# Patient Record
Sex: Female | Born: 1980 | Race: White | Hispanic: No | Marital: Married | Smoking: Never smoker
Health system: Southern US, Community
[De-identification: ages and names within clinical notes are randomized; demographics above are authoritative.]

## PROBLEM LIST (undated history)

## (undated) DIAGNOSIS — I499 Cardiac arrhythmia, unspecified: Secondary | ICD-10-CM

## (undated) DIAGNOSIS — N939 Abnormal uterine and vaginal bleeding, unspecified: Secondary | ICD-10-CM

## (undated) DIAGNOSIS — I10 Essential (primary) hypertension: Secondary | ICD-10-CM

## (undated) DIAGNOSIS — K219 Gastro-esophageal reflux disease without esophagitis: Secondary | ICD-10-CM

## (undated) DIAGNOSIS — R748 Abnormal levels of other serum enzymes: Secondary | ICD-10-CM

## (undated) DIAGNOSIS — F419 Anxiety disorder, unspecified: Secondary | ICD-10-CM

## (undated) DIAGNOSIS — R0602 Shortness of breath: Secondary | ICD-10-CM

## (undated) DIAGNOSIS — I471 Supraventricular tachycardia, unspecified: Secondary | ICD-10-CM

## (undated) DIAGNOSIS — F32A Depression, unspecified: Secondary | ICD-10-CM

## (undated) DIAGNOSIS — R112 Nausea with vomiting, unspecified: Secondary | ICD-10-CM

## (undated) DIAGNOSIS — F99 Mental disorder, not otherwise specified: Secondary | ICD-10-CM

## (undated) DIAGNOSIS — M797 Fibromyalgia: Secondary | ICD-10-CM

## (undated) DIAGNOSIS — Z9889 Other specified postprocedural states: Secondary | ICD-10-CM

## (undated) HISTORY — PX: DENTAL SURGERY: SHX609

## (undated) HISTORY — PX: BREAST SURGERY: SHX581

---

## 1998-06-01 ENCOUNTER — Other Ambulatory Visit: Admission: RE | Admit: 1998-06-01 | Discharge: 1998-06-01 | Payer: Self-pay | Admitting: *Deleted

## 1999-06-17 ENCOUNTER — Other Ambulatory Visit: Admission: RE | Admit: 1999-06-17 | Discharge: 1999-06-17 | Payer: Self-pay | Admitting: *Deleted

## 2000-08-24 ENCOUNTER — Emergency Department (HOSPITAL_COMMUNITY): Admission: EM | Admit: 2000-08-24 | Discharge: 2000-08-24 | Payer: Self-pay | Admitting: Emergency Medicine

## 2001-07-20 ENCOUNTER — Encounter: Payer: Self-pay | Admitting: Gastroenterology

## 2001-07-20 ENCOUNTER — Ambulatory Visit (HOSPITAL_COMMUNITY): Admission: RE | Admit: 2001-07-20 | Discharge: 2001-07-20 | Payer: Self-pay | Admitting: Gastroenterology

## 2005-04-27 ENCOUNTER — Ambulatory Visit (HOSPITAL_COMMUNITY): Admission: RE | Admit: 2005-04-27 | Discharge: 2005-04-27 | Payer: Self-pay | Admitting: Family Medicine

## 2011-09-30 ENCOUNTER — Other Ambulatory Visit (HOSPITAL_COMMUNITY)
Admission: RE | Admit: 2011-09-30 | Discharge: 2011-09-30 | Disposition: A | Discharge status: Home / Self Care | Payer: 59 | Source: Ambulatory Visit | Attending: Obstetrics and Gynecology | Admitting: Obstetrics and Gynecology

## 2011-09-30 ENCOUNTER — Other Ambulatory Visit: Payer: Self-pay | Admitting: Obstetrics and Gynecology

## 2011-09-30 DIAGNOSIS — Z01419 Encounter for gynecological examination (general) (routine) without abnormal findings: Secondary | ICD-10-CM | POA: Insufficient documentation

## 2011-11-09 ENCOUNTER — Encounter (HOSPITAL_COMMUNITY): Payer: Self-pay | Admitting: Pharmacist

## 2011-11-10 NOTE — Patient Instructions (Addendum)
YOUR PROCEDURE IS SCHEDULED ON:11/16/11  [facility] AT:1200 pm  USE DESK PHONE AND DIAL [phone] TO INFORM us OF YOUR ARRIVAL  CALL [phone] IF YOU HAVE ANY QUESTIONS OR PROBLEMS PRIOR TO YOUR ARRIVAL.  REMEMBER: DO NOT EAT AFTER MIDNIGHT :Tuesday  SPECIAL INSTRUCTIONS:clear liquids ok until 9:30 am   YOU MAY BRUSH YOUR TEETH THE MORNING OF SURGERY   TAKE THESE MEDICINES THE DAY OF SURGERY WITH SIP OF WATER:morning meds   DO NOT WEAR JEWELRY, EYE MAKEUP, LIPSTICK OR DARK FINGERNAIL POLISH DO NOT WEAR LOTIONS  DO NOT SHAVE FOR 48 HOURS PRIOR TO SURGERY  YOU WILL NOT BE ALLOWED TO DRIVE YOURSELF HOME.  NAME OF DRIVER: Fredrik Cove

## 2011-11-11 ENCOUNTER — Encounter (HOSPITAL_COMMUNITY)
Admission: RE | Admit: 2011-11-11 | Discharge: 2011-11-11 | Disposition: A | Discharge status: Home / Self Care | Payer: 59 | Source: Ambulatory Visit | Attending: Obstetrics and Gynecology | Admitting: Obstetrics and Gynecology

## 2011-11-11 ENCOUNTER — Encounter (HOSPITAL_COMMUNITY): Payer: Self-pay

## 2011-11-11 ENCOUNTER — Other Ambulatory Visit: Payer: Self-pay | Admitting: Obstetrics and Gynecology

## 2011-11-11 HISTORY — DX: Cardiac arrhythmia, unspecified: I49.9

## 2011-11-11 HISTORY — DX: Shortness of breath: R06.02

## 2011-11-11 HISTORY — DX: Mental disorder, not otherwise specified: F99

## 2011-11-11 HISTORY — DX: Abnormal uterine and vaginal bleeding, unspecified: N93.9

## 2011-11-11 HISTORY — DX: Gastro-esophageal reflux disease without esophagitis: K21.9

## 2011-11-11 HISTORY — DX: Abnormal levels of other serum enzymes: R74.8

## 2011-11-11 HISTORY — DX: Anxiety disorder, unspecified: F41.9

## 2011-11-11 LAB — DIFFERENTIAL
Lymphocytes Relative: 41 % (ref 12–46)
Monocytes Absolute: 0.6 10*3/uL (ref 0.1–1.0)
Monocytes Relative: 8 % (ref 3–12)
Neutro Abs: 3.9 10*3/uL (ref 1.7–7.7)
Neutrophils Relative %: 49 % (ref 43–77)

## 2011-11-11 LAB — BASIC METABOLIC PANEL
Calcium: 9.9 mg/dL (ref 8.4–10.5)
Chloride: 102 mEq/L (ref 96–112)
Creatinine, Ser: 0.62 mg/dL (ref 0.50–1.10)
GFR calc Af Amer: >90 mL/min (ref >90)
GFR calc non Af Amer: >90 mL/min (ref >90)

## 2011-11-11 LAB — CBC
MCHC: 33.8 g/dL (ref 30.0–36.0)
MCV: 89.2 fL (ref 78.0–100.0)
Platelets: 258 10*3/uL (ref 150–400)
RDW: 13.5 % (ref 11.5–15.5)
WBC: 7.8 10*3/uL (ref 4.0–10.5)

## 2011-11-13 ENCOUNTER — Other Ambulatory Visit: Payer: Self-pay | Admitting: Obstetrics and Gynecology

## 2011-11-13 NOTE — H&P (Signed)
10/28/2011  Reason for Appointment  1. PreOp/Clearance from Dr. Nicholos Johns   History of Present Illness  General:  [name] presents for preop for Hysteroscopy/D&C/pap smear work up abnormal uterine bleeding. Pt is morbidly obese with a long h/o spotting and intermittently heavy bleeding. On initial presentation, I was unable to see cervix b/c it was displaced well behind the symphisis pubis. Due to habitus, I was also unable to palpate her uterus. Ultrasound shows a thickened EMB, 1.69 cm and and enlarged anteverted uterus, 13 cm. [name] symptoms clearly warranted an EMB, but due to the difficulty with the pelvic exam, an exam under anesthesia and te above procedures was recommended. Pt has gotten surgical clearance from PCP Dr. Nicholos Johns. Pt still with light bleeding.   Current Medications  Lorazepam 0.5 MG Tablet 1 tablets as neededDr. Raquel James  Lisinopril-Hydrochlorothiazide 20-12.5 MG Tablet 1 tablet every morning  Toprol XL 100 MG Tablet Extended Release 24 Hour 1 tablet every day  Medication List reviewed and reconciled with the patient   Past Medical History  SVT  White coat hypertension  Elevated liver enzymes   Surgical History  C section   wisdom teeth removed   liver bx    Family History  Mother: hypertension, hyperlipidemia, diabetes   Brother 1: diabetes   Maternal Great GM with breast cancer.   Social History  General:  History of smoking  cigarettes: Former smoker Quit in year 2001 Pack-year Hx: 11 Smoking: quit in 2001.  Tobacco Exposure: Rarely.  Alcohol: occasionally.  Recreational drug use: never.  Exercise: minimal.  Marital Status: married.  Children: Boys, 1.    Gyn History  Periods : irregular, use to be every 2- 6 months.  LMP always has a brown colored discharge x 2 years, had a period after visit at OB/GYN 10/03/11.  Denies H/O Birth control .  Last pap smear date 09/30/11, WNL, no ecc.  Denies H/O Last mammogram date .  Denies H/O Abnormal pap  smear .  Denies H/O STD .  Menarche 11.    OB History  Number of pregnancies 1.  Pregnancy # 1 live birth, C-section delivery.    Allergies  Aspirin: UPSET STOMACH  Sulfa drugs (for allergy): HIVES   Hospitalization/Major Diagnostic Procedure  childbirth    Review of Systems  Denies SOB, chest pain, n/v, fever or chills.   Vital Signs  Wt 266, Wt change -.4 lb, Ht 63, BMI 47.11, Pulse sitting 85, BP sitting 126/79.   Physical Examination  NECK:  Thyroid: no thyromegaly.  HEART:  Heart sounds: normal, RRR, no murmur.  ABDOMEN:  General: no masses tenderness or organomegaly, soft, non distended, increased pannus.  FEMALE GENITOURINARY:  General deferred.  EXTREMITIES:  General: No calf tenderness.     Assessments   1. Preop examination - V72.84 (Primary)   2. Abnormal menses - 626.9   3. Morbid obesity - 278.01   Treatment  1. Preop examination  Pt counseled on R/B/A of procedure. Pt desires to proceed with surgery. Will likely prescribe Provera post op if endometrial sampling is normal.    2. Abnormal menses  Referral To: Reason:Precert and schedule-Hyst/D&C, Pap Smeat    Follow Up  2 weeks post op

## 2011-11-15 MED ORDER — CEFAZOLIN SODIUM-DEXTROSE 2-3 GM-% IV SOLR
2.0000 g | INTRAVENOUS | Status: AC
  Administered 2011-11-16: 2 g via INTRAVENOUS
  Filled 2011-11-15: qty 50

## 2011-11-16 ENCOUNTER — Encounter (HOSPITAL_COMMUNITY)
Admission: RE | Disposition: A | Discharge status: Home / Self Care | Payer: Self-pay | Source: Ambulatory Visit | Attending: Obstetrics and Gynecology

## 2011-11-16 ENCOUNTER — Encounter (HOSPITAL_COMMUNITY): Payer: Self-pay | Admitting: Anesthesiology

## 2011-11-16 ENCOUNTER — Encounter (HOSPITAL_COMMUNITY): Payer: Self-pay | Admitting: *Deleted

## 2011-11-16 ENCOUNTER — Ambulatory Visit (HOSPITAL_COMMUNITY): Payer: 59 | Admitting: Anesthesiology

## 2011-11-16 ENCOUNTER — Ambulatory Visit (HOSPITAL_COMMUNITY)
Admission: RE | Admit: 2011-11-16 | Discharge: 2011-11-16 | Disposition: A | Discharge status: Home / Self Care | Payer: 59 | Source: Ambulatory Visit | Attending: Obstetrics and Gynecology | Admitting: Obstetrics and Gynecology

## 2011-11-16 DIAGNOSIS — N949 Unspecified condition associated with female genital organs and menstrual cycle: Secondary | ICD-10-CM | POA: Insufficient documentation

## 2011-11-16 DIAGNOSIS — Z01812 Encounter for preprocedural laboratory examination: Secondary | ICD-10-CM | POA: Insufficient documentation

## 2011-11-16 DIAGNOSIS — N938 Other specified abnormal uterine and vaginal bleeding: Secondary | ICD-10-CM | POA: Insufficient documentation

## 2011-11-16 DIAGNOSIS — Z01818 Encounter for other preprocedural examination: Secondary | ICD-10-CM | POA: Insufficient documentation

## 2011-11-16 HISTORY — PX: HYSTEROSCOPY WITH D & C: SHX1775

## 2011-11-16 SURGERY — DILATATION AND CURETTAGE /HYSTEROSCOPY
Anesthesia: General | Site: Uterus | Wound class: Clean Contaminated

## 2011-11-16 MED ORDER — EPHEDRINE 5 MG/ML INJ
INTRAVENOUS | Status: AC
  Filled 2011-11-16: qty 10

## 2011-11-16 MED ORDER — IBUPROFEN 600 MG PO TABS: 600.0000 mg | ORAL_TABLET | Freq: Three times a day (TID) | ORAL | Status: DC | PRN

## 2011-11-16 MED ORDER — MIDAZOLAM HCL 5 MG/5ML IJ SOLN
INTRAMUSCULAR | Status: DC | PRN
  Administered 2011-11-16: 2 mg via INTRAVENOUS

## 2011-11-16 MED ORDER — DEXAMETHASONE SODIUM PHOSPHATE 10 MG/ML IJ SOLN
INTRAMUSCULAR | Status: AC
  Filled 2011-11-16: qty 1

## 2011-11-16 MED ORDER — FENTANYL CITRATE 0.05 MG/ML IJ SOLN
INTRAMUSCULAR | Status: AC
  Filled 2011-11-16: qty 2

## 2011-11-16 MED ORDER — ONDANSETRON HCL 4 MG/2ML IJ SOLN
INTRAMUSCULAR | Status: AC
  Filled 2011-11-16: qty 2

## 2011-11-16 MED ORDER — METOCLOPRAMIDE HCL 5 MG/ML IJ SOLN: 10.0000 mg | Freq: Once | INTRAMUSCULAR | Status: DC | PRN

## 2011-11-16 MED ORDER — LIDOCAINE HCL (CARDIAC) 20 MG/ML IV SOLN
INTRAVENOUS | Status: DC | PRN
  Administered 2011-11-16: 100 mg via INTRAVENOUS

## 2011-11-16 MED ORDER — LIDOCAINE HCL (CARDIAC) 20 MG/ML IV SOLN
INTRAVENOUS | Status: AC
  Filled 2011-11-16: qty 5

## 2011-11-16 MED ORDER — SODIUM CHLORIDE 0.9 % IR SOLN
Status: DC | PRN
  Administered 2011-11-16: 1

## 2011-11-16 MED ORDER — LACTATED RINGERS IV SOLN
INTRAVENOUS | Status: DC
  Administered 2011-11-16 (×2): via INTRAVENOUS

## 2011-11-16 MED ORDER — MIDAZOLAM HCL 2 MG/2ML IJ SOLN
INTRAMUSCULAR | Status: AC
  Filled 2011-11-16: qty 2

## 2011-11-16 MED ORDER — METOCLOPRAMIDE HCL 5 MG/ML IJ SOLN
INTRAMUSCULAR | Status: DC | PRN
  Administered 2011-11-16: 10 mg via INTRAVENOUS

## 2011-11-16 MED ORDER — PROPOFOL 10 MG/ML IV EMUL
INTRAVENOUS | Status: DC | PRN
  Administered 2011-11-16: 200 mg via INTRAVENOUS

## 2011-11-16 MED ORDER — METOCLOPRAMIDE HCL 5 MG/ML IJ SOLN
INTRAMUSCULAR | Status: AC
  Filled 2011-11-16: qty 2

## 2011-11-16 MED ORDER — PROPOFOL 10 MG/ML IV EMUL
INTRAVENOUS | Status: AC
  Filled 2011-11-16: qty 20

## 2011-11-16 MED ORDER — LIDOCAINE HCL 2 % IJ SOLN
INTRAMUSCULAR | Status: AC
  Filled 2011-11-16: qty 1

## 2011-11-16 MED ORDER — ONDANSETRON HCL 4 MG/2ML IJ SOLN
INTRAMUSCULAR | Status: DC | PRN
  Administered 2011-11-16: 4 mg via INTRAVENOUS

## 2011-11-16 MED ORDER — DEXAMETHASONE SODIUM PHOSPHATE 10 MG/ML IJ SOLN
INTRAMUSCULAR | Status: DC | PRN
  Administered 2011-11-16: 10 mg via INTRAVENOUS

## 2011-11-16 MED ORDER — EPHEDRINE SULFATE 50 MG/ML IJ SOLN
INTRAMUSCULAR | Status: DC | PRN
  Administered 2011-11-16 (×2): 10 mg via INTRAVENOUS

## 2011-11-16 MED ORDER — FENTANYL CITRATE 0.05 MG/ML IJ SOLN
INTRAMUSCULAR | Status: DC | PRN
  Administered 2011-11-16: 50 ug via INTRAVENOUS
  Administered 2011-11-16: 100 ug via INTRAVENOUS
  Administered 2011-11-16: 50 ug via INTRAVENOUS

## 2011-11-16 MED ORDER — MEPERIDINE HCL 25 MG/ML IJ SOLN: 6.2500 mg | INTRAMUSCULAR | Status: DC | PRN

## 2011-11-16 SURGICAL SUPPLY — 13 items
CANISTER SUCTION 2500CC (MISCELLANEOUS) ×2 IMPLANT
CATH ROBINSON RED A/P 16FR (CATHETERS) ×2 IMPLANT
CLOTH BEACON ORANGE TIMEOUT ST (SAFETY) ×2 IMPLANT
CONTAINER PREFILL 10% NBF 60ML (FORM) ×4 IMPLANT
DILATOR CANAL MILEX (MISCELLANEOUS) IMPLANT
GLOVE BIO SURGEON STRL SZ7 (GLOVE) ×6 IMPLANT
GLOVE BIOGEL PI IND STRL 7.0 (GLOVE) ×3 IMPLANT
GLOVE BIOGEL PI INDICATOR 7.0 (GLOVE) ×3
GOWN PREVENTION PLUS LG XLONG (DISPOSABLE) ×4 IMPLANT
GOWN STRL REIN XL XLG (GOWN DISPOSABLE) ×2 IMPLANT
PACK HYSTEROSCOPY LF (CUSTOM PROCEDURE TRAY) ×2 IMPLANT
TOWEL OR 17X24 6PK STRL BLUE (TOWEL DISPOSABLE) ×4 IMPLANT
WATER STERILE IRR 1000ML POUR (IV SOLUTION) ×2 IMPLANT

## 2011-11-16 NOTE — Anesthesia Postprocedure Evaluation (Signed)
  Anesthesia Post-op Note  Patient: Sabrina Jordan  Procedure(s) Performed: Procedure(s) (LRB): DILATATION AND CURETTAGE /HYSTEROSCOPY (N/A)  Patient is awake and responsive. Pain and nausea are reasonably well controlled. Vital signs are stable and clinically acceptable. Oxygen saturation is clinically acceptable. There are no apparent anesthetic complications at this time. Patient is ready for discharge.

## 2011-11-16 NOTE — Discharge Instructions (Signed)
Dilation and Curettage or Vacuum Curettage  Care After  Refer to this sheet in the next few weeks. These instructions provide you with general information on caring for yourself after your procedure. Your caregiver may also give you more specific instructions. Your treatment has been planned according to current medical practices, but problems sometimes occur. Call your caregiver if you have any problems or questions after your procedure.  HOME CARE INSTRUCTIONS   · Do not drive for 24 hours.  · Wait 1 week before returning to strenuous activities.  · Take your temperature 2 times a day for 4 days and write it down. Provide these temperatures to your caregiver if you develop a fever.  · Avoid long periods of standing, and do no heavy lifting (more than 10 pounds or 4.5 kg), pushing, or pulling.  · Limit stair climbing to once or twice a day.  · Take rest periods often.  · You may resume your usual diet.  · Drink enough fluids to keep your urine clear or pale yellow.  · You should return to your usual bowel function. If constipation should occur, you may:  · Take a mild laxative with permission from your caregiver.  · Add fruit and bran to your diet.  · Drink more fluids.  · Take showers instead of baths until your caregiver gives you permission to take baths.  · Do not go swimming or use a hot tub until your caregiver gives you permission.  · Try to have someone with you or available to you the first 24 to 48 hours, especially if you had a general anesthetic.  · Do not douche, use tampons, or have intercourse until after your follow-up appointment, or when your caregiver approves.  · Only take over-the-counter or prescription medicines for pain, discomfort, or fever as directed by your caregiver. Do not take aspirin. It can cause bleeding.  · If a prescription was given, follow your caregiver's directions.  · Keep all your follow-up appointments recommended by your caregiver.  SEEK MEDICAL CARE IF:   · You have  increasing cramps or pain not relieved with medicine.  · You have abdominal pain which does not seem to be related to the same area of earlier cramping and pain.  · You have bad smelling vaginal discharge.  · You have a rash.  · You have problems with any medicine.  SEEK IMMEDIATE MEDICAL CARE IF:   · You have bleeding that is heavier than a normal menstrual period.  · You have a fever.  · You have chest pain.  · You have shortness of breath.  · You feel dizzy or feel like fainting.  · You pass out.  · You have pain in your shoulder strap area.  · You have heavy vaginal bleeding with or without blood clots.  MAKE SURE YOU:   · Understand these instructions.  · Will watch your condition.  · Will get help right away if you are not doing well or get worse.  Document Released: 08/05/2000 Document Revised: 07/28/2011 Document Reviewed: 03/05/2009  [employer]® Patient Information ©2012 [employer].

## 2011-11-16 NOTE — H&P (View-Only) (Signed)
10/28/2011  Reason for Appointment  1. PreOp/Clearance from Dr. Reade   History of Present Illness  General:  [name] presents for preop for Hysteroscopy/D&C/pap smear work up abnormal uterine bleeding. Pt is morbidly obese with a long h/o spotting and intermittently heavy bleeding. On initial presentation, I was unable to see cervix b/c it was displaced well behind the symphisis pubis. Due to habitus, I was also unable to palpate her uterus. Ultrasound shows a thickened EMB, 1.69 cm and and enlarged anteverted uterus, 13 cm. [name] symptoms clearly warranted an EMB, but due to the difficulty with the pelvic exam, an exam under anesthesia and te above procedures was recommended. Pt has gotten surgical clearance from PCP Dr. Reade. Pt still with light bleeding.   Current Medications  Lorazepam 0.5 MG Tablet 1 tablets as neededDr. Pittman  Lisinopril-Hydrochlorothiazide 20-12.5 MG Tablet 1 tablet every morning  Toprol XL 100 MG Tablet Extended Release 24 Hour 1 tablet every day  Medication List reviewed and reconciled with the patient   Past Medical History  SVT  White coat hypertension  Elevated liver enzymes   Surgical History  C section   wisdom teeth removed   liver bx    Family History  Mother: hypertension, hyperlipidemia, diabetes   Brother 1: diabetes   Maternal Great GM with breast cancer.   Social History  General:  History of smoking  cigarettes: Former smoker Quit in year 2001 Pack-year Hx: 11 Smoking: quit in 2001.  Tobacco Exposure: Rarely.  Alcohol: occasionally.  Recreational drug use: never.  Exercise: minimal.  Marital Status: married.  Children: Boys, 1.    Gyn History  Periods : irregular, use to be every 2- 6 months.  LMP always has a brown colored discharge x 2 years, had a period after visit at OB/GYN 10/03/11.  Denies H/O Birth control .  Last pap smear date 09/30/11, WNL, no ecc.  Denies H/O Last mammogram date .  Denies H/O Abnormal pap  smear .  Denies H/O STD .  Menarche 11.    OB History  Number of pregnancies 1.  Pregnancy # 1 live birth, C-section delivery.    Allergies  Aspirin: UPSET STOMACH  Sulfa drugs (for allergy): HIVES   Hospitalization/Major Diagnostic Procedure  childbirth    Review of Systems  Denies SOB, chest pain, n/v, fever or chills.   Vital Signs  Wt 266, Wt change -.4 lb, Ht 63, BMI 47.11, Pulse sitting 85, BP sitting 126/79.   Physical Examination  NECK:  Thyroid: no thyromegaly.  HEART:  Heart sounds: normal, RRR, no murmur.  ABDOMEN:  General: no masses tenderness or organomegaly, soft, non distended, increased pannus.  FEMALE GENITOURINARY:  General deferred.  EXTREMITIES:  General: No calf tenderness.     Assessments   1. Preop examination - V72.84 (Primary)   2. Abnormal menses - 626.9   3. Morbid obesity - 278.01   Treatment  1. Preop examination  Pt counseled on R/B/A of procedure. Pt desires to proceed with surgery. Will likely prescribe Provera post op if endometrial sampling is normal.    2. Abnormal menses  Referral To: Reason:Precert and schedule-Hyst/D&C, Pap Smeat    Follow Up  2 weeks post op    

## 2011-11-16 NOTE — Transfer of Care (Signed)
Immediate Anesthesia Transfer of Care Note  Patient: Sabrina Jordan  Procedure(s) Performed: Procedure(s) (LRB): DILATATION AND CURETTAGE /HYSTEROSCOPY (N/A)  Patient Location: PACU  Anesthesia Type: General  Level of Consciousness: sedated  Airway & Oxygen Therapy: Patient Spontanous Breathing and Patient connected to nasal cannula oxygen  Post-op Assessment: Report given to PACU RN and Post -op Vital signs reviewed and stable  Post vital signs: stable  Complications: No apparent anesthesia complications

## 2011-11-16 NOTE — Interval H&P Note (Signed)
History and Physical Interval Note:  11/16/2011 1:10 PM  Neita Briddell  has presented today for surgery, with the diagnosis of Abnormal Bleeding/Obesity  The various methods of treatment have been discussed with the patient and family. After consideration of risks, benefits and other options for treatment, the patient has consented to  Procedure(s) (LRB): DILATATION AND CURETTAGE /HYSTEROSCOPY (N/A) as a surgical intervention .  The patients' history has been reviewed, patient examined, no change in status, stable for surgery.  I have reviewed the patients' chart and labs.  Questions were answered to the patient's satisfaction.     Royden Bulman  Pt with heavy  Bleeding x 3 weeks. Almost went to the ER, but it slowed down.

## 2011-11-16 NOTE — Anesthesia Preprocedure Evaluation (Addendum)
Anesthesia Evaluation  Patient identified by MRN, date of birth, ID band Patient awake    Reviewed: Allergy & Precautions, H&P , NPO status , Patient's Chart, lab work & pertinent test results  Airway Mallampati: III TM Distance: >3 FB Neck ROM: Full    Dental No notable dental hx. (+) Dental Advisory Given and Chipped,    Pulmonary shortness of breath and with exertion,  breath sounds clear to auscultation  Pulmonary exam normal       Cardiovascular hypertension, Pt. on medications and Pt. on home beta blockers + dysrhythmias Supra Ventricular Tachycardia Rhythm:Regular Rate:Normal     Neuro/Psych Anxiety Mentally handicapped   GI/Hepatic GERD-  Medicated and Controlled,Elevated LFT's   Endo/Other  Morbid obesity  Renal/GU negative Renal ROS  negative genitourinary   Musculoskeletal negative musculoskeletal ROS (+)   Abdominal (+) + obese,   Peds  Hematology negative hematology ROS (+)   Anesthesia Other Findings   Reproductive/Obstetrics negative OB ROS                          Anesthesia Physical Anesthesia Plan  ASA: III  Anesthesia Plan: General   Post-op Pain Management:    Induction: Intravenous  Airway Management Planned: LMA  Additional Equipment:   Intra-op Plan:   Post-operative Plan: Extubation in OR  Informed Consent: I have reviewed the patients History and Physical, chart, labs and discussed the procedure including the risks, benefits and alternatives for the proposed anesthesia with the patient or authorized representative who has indicated his/her understanding and acceptance.   Dental advisory given  Plan Discussed with: CRNA, Anesthesiologist and Surgeon  Anesthesia Plan Comments:         Anesthesia Quick Evaluation

## 2011-11-16 NOTE — Brief Op Note (Signed)
11/16/2011  2:22 PM  PATIENT:  Sabrina Jordan  31 y.o. female  PRE-OPERATIVE DIAGNOSIS:  Abnormal Bleeding/Obesity, Unable to visualize cervix  POST-OPERATIVE DIAGNOSIS:  Abnormal Bleeding/Obesity  PROCEDURE:  Procedure(s) (LRB): DILATATION AND CURETTAGE /HYSTEROSCOPY (N/A), Pap smear  SURGEON:  Surgeon(s) and Role:    * Geryl Rankins, MD - Primary  PHYSICIAN ASSISTANT: None  ASSISTANTS: Technician   ANESTHESIA:   general and LMA  EBL:  Total I/O In: 1300 [I.V.:1300] Out: 100 [Urine:100]  BLOOD ADMINISTERED:none  DRAINS: none   LOCAL MEDICATIONS USED:  NONE  SPECIMEN:  Source of Specimen:  Endometrial currettings  DISPOSITION OF SPECIMEN:  PATHOLOGY  COUNTS:  YES  TOURNIQUET:  * No tourniquets in log *  DICTATION: .Other Dictation: Dictation Number [id number]  PLAN OF CARE: Discharge to home after PACU  PATIENT DISPOSITION:  PACU - hemodynamically stable.   Delay start of Pharmacological VTE agent (>24hrs) due to surgical blood loss or risk of bleeding: not applicable

## 2011-11-17 NOTE — Op Note (Signed)
Sabrina Jordan, Sabrina Jordan            ACCOUNT NO.:  [id number]  MEDICAL RECORD NO.:  [id number]  LOCATION:  [facility]                          FACILITY:  [facility]  PHYSICIAN:  Pieter Partridge, MD   DATE OF BIRTH:  Jul 07, 1981  DATE OF PROCEDURE:  11/16/2011 DATE OF DISCHARGE:  11/16/2011                              OPERATIVE REPORT   PREOPERATIVE DIAGNOSES:  Abnormal uterine bleeding, morbid obesity, and inability to visualize the cervix.  POSTOPERATIVE DIAGNOSES:  Abnormal uterine bleeding, morbid obesity, and inability to visualize the cervix.  PROCEDURES:  Hysteroscopy, D and C, and Pap smear.  SURGEON:  Shela Nevin. Dion Body, MD  ASSISTANT:  Technician.  ANESTHESIA:  General and LMA.  EBL:  Minimal.  IV FLUIDS:  1300 mL.  URINE:  100 mL.  No drains.  No local anesthesia.  SPECIMEN:  Endometrial curettings to pathology.  DISPOSITION:  To PACU, hemodynamically stable.  FINDINGS:  Enlarged uterus sounding to 12 cm.  Copious amounts of endometrial tissue, some hyperpigmented areas at the uterine fundus.  No fibroids or polyps.  No obvious polyps identified.  Cervix is small followed by the less than size of a quarter and very anterior and smooth.  PROCEDURE IN DETAIL:  [name] was identified in the holding area. She was then taken to the operating room with IV running.  She underwent LMA anesthesia without complications.  She was placed in the dorsal lithotomy position, and prepped and draped in the normal sterile fashion.  I started with the Pap smear and due to an inability to see her cervix in the office, a long weighted speculum and a narrow Deaver was used to visualize her cervix.  The anterior lip of the cervix was grasped with a single-tooth tenaculum and brought forward and the Pap smear was done with the spatula and a brush.  The collection devices were not sterile, so the cervix and the vagina was re-prepped while the instruments were changed and my gloves  were changed.  The cervix was then dilated up to a 10 Hegar and sounded as above.  The hysteroscope was advanced to the uterine fundus, ostia were identified and the findings above were noted.  Curette was passed through the endocervix and a sharp curettage of all four quadrants until there was a gritty cry noted was performed.  Got lots and lots of tissue at one point just sheath of endometrial tissue.  The hysteroscope was then advanced through the uterus and advanced through the fundus and fluid bubbles were noted at the top indicative of no perforation and the uterus appeared to be clean.  The procedure in order to visualize the patient's cervix, the weighted speculum had to be pushed, held in place and the narrow Deaver had to be held by the assistant, probably from the vulva to the cervix was probably 10 cm.  The patient tolerated the procedure well.  All instrument and sponge counts were correct x3.  Fluid deficit was approximately 250 of normal saline.  SCDs were in place and she got 2 g of Ancef prior to the procedure.     Pieter Partridge, MD     EBV/MEDQ  D:  11/16/2011  T:  11/17/2011  Job:  [id number]

## 2011-11-18 ENCOUNTER — Encounter (HOSPITAL_COMMUNITY): Payer: Self-pay | Admitting: Obstetrics and Gynecology

## 2014-10-21 ENCOUNTER — Other Ambulatory Visit: Payer: Self-pay | Admitting: Obstetrics and Gynecology

## 2014-10-21 ENCOUNTER — Other Ambulatory Visit (HOSPITAL_COMMUNITY)
Admission: RE | Admit: 2014-10-21 | Discharge: 2014-10-21 | Disposition: A | Discharge status: Home / Self Care | Payer: 59 | Source: Ambulatory Visit | Attending: Obstetrics and Gynecology | Admitting: Obstetrics and Gynecology

## 2014-10-21 DIAGNOSIS — Z01419 Encounter for gynecological examination (general) (routine) without abnormal findings: Secondary | ICD-10-CM | POA: Diagnosis present

## 2014-10-21 DIAGNOSIS — Z1151 Encounter for screening for human papillomavirus (HPV): Secondary | ICD-10-CM | POA: Insufficient documentation

## 2014-10-22 LAB — CYTOLOGY - PAP

## 2016-09-12 ENCOUNTER — Emergency Department (HOSPITAL_COMMUNITY)
Admission: EM | Admit: 2016-09-12 | Discharge: 2016-09-12 | Disposition: A | Discharge status: Home / Self Care | Payer: 59 | Attending: Emergency Medicine | Admitting: Emergency Medicine

## 2016-09-12 ENCOUNTER — Encounter (HOSPITAL_COMMUNITY): Payer: Self-pay

## 2016-09-12 DIAGNOSIS — Z79899 Other long term (current) drug therapy: Secondary | ICD-10-CM | POA: Diagnosis not present

## 2016-09-12 DIAGNOSIS — M62838 Other muscle spasm: Secondary | ICD-10-CM | POA: Insufficient documentation

## 2016-09-12 DIAGNOSIS — M542 Cervicalgia: Secondary | ICD-10-CM | POA: Diagnosis present

## 2016-09-12 MED ORDER — HYDROCODONE-ACETAMINOPHEN 5-325 MG PO TABS: 1.0000 | ORAL_TABLET | Freq: Four times a day (QID) | ORAL | 0 refills | Status: DC | PRN

## 2016-09-12 MED ORDER — CYCLOBENZAPRINE HCL 10 MG PO TABS: 10.0000 mg | ORAL_TABLET | Freq: Three times a day (TID) | ORAL | 0 refills | Status: DC | PRN

## 2016-09-12 MED ORDER — NAPROXEN 500 MG PO TABS: 500.0000 mg | ORAL_TABLET | Freq: Two times a day (BID) | ORAL | 0 refills | Status: DC

## 2016-09-12 MED ORDER — KETOROLAC TROMETHAMINE 60 MG/2ML IM SOLN
60.0000 mg | Freq: Once | INTRAMUSCULAR | Status: AC
  Administered 2016-09-12: 60 mg via INTRAMUSCULAR
  Filled 2016-09-12: qty 2

## 2016-09-12 MED ORDER — HYDROMORPHONE HCL 2 MG/ML IJ SOLN
2.0000 mg | Freq: Once | INTRAMUSCULAR | Status: AC
  Administered 2016-09-12: 2 mg via INTRAMUSCULAR
  Filled 2016-09-12: qty 1

## 2016-09-12 NOTE — ED Triage Notes (Signed)
Pt states she started having neck pain with stiffness and radiation to back and shoulders on last Wednesday; pt states she was helping to lift husband over a week ago while he was in hospital but unsure if she injured her self by doing so; Pt states pain at 10/10 with no relief; pt a&o 4 on arrival and able to ambulate independently at triage

## 2016-09-12 NOTE — ED Notes (Signed)
Pt reports dizziness after dilaudid. EDP aware. This RN offered for pt to stay and try PO food/fluids prior to discharge; however, pt states she would like to leave still. Discussed that staying for a little while longer could be helpful to resolve dizziness. Pt agreed at this time.

## 2016-09-12 NOTE — ED Provider Notes (Signed)
[facility]-EMERGENCY DEPT Provider Note   CSN: [id number] Arrival date & time: 09/12/16  [id number]     History   Chief Complaint Chief Complaint  Patient presents with  . Torticollis  . Back Pain  . Shoulder Pain    HPI Sabrina Jordan is a 36 y.o. female.  HPI  36 year old female presents with severe left-sided neck pain. Patient states that the pain started 5 days ago and has progressively worsened. She's tried heat, ice, and over-the-counter lidocaine/cortisone patches. Nothing is helped. Pain has progressively worsened. The pain when severe radiates up her head and sometimes down her back. No fevers. No weakness/numbness. No known injury. LMP first of the month. No chest/abdominal pain. Feels like there's a knot on her left shoulder.  Past Medical History:  Diagnosis Date  . Abnormal uterine bleeding   . Anxiety    panic disorder  . Dysrhythmia    sinus ventricular tachycardia per pt  . Elevated liver enzymes    no diagnosis- neg work up- intermittent elevations  . GERD (gastroesophageal reflux disease)    no meds currently  . Mental disorder    PTSD  . Shortness of breath    due to weight    There are no active problems to display for this patient.   Past Surgical History:  Procedure Laterality Date  . CESAREAN SECTION    . DENTAL SURGERY    . HYSTEROSCOPY W/D&C  11/16/2011   Procedure: DILATATION AND CURETTAGE /HYSTEROSCOPY;  Surgeon: Geryl RankinsEvelyn Varnado, MD;  Location: [facility];  Service: Gynecology;  Laterality: N/A;  pap smear performed    OB History    No data available       Home Medications    Prior to Admission medications   Medication Sig Start Date End Date Taking? Authorizing Provider  acyclovir (ZOVIRAX) 400 MG tablet Take 400 mg by mouth 3 (three) times daily. 07/27/16  Yes Historical Provider, MD  lisinopril-hydrochlorothiazide (PRINZIDE,ZESTORETIC) 20-12.5 MG per tablet Take 1 tablet by mouth daily.   Yes Historical Provider, MD  medroxyPROGESTERone  (PROVERA) 5 MG tablet Take 2.5 mg by mouth See admin instructions. Take for 10 days per month (20th-30th) 01/13/16  Yes Historical Provider, MD  metoprolol succinate (TOPROL-XL) 100 MG 24 hr tablet Take 100 mg by mouth daily. Take with or immediately following a meal.   Yes Historical Provider, MD  cyclobenzaprine (FLEXERIL) 10 MG tablet Take 1 tablet (10 mg total) by mouth 3 (three) times daily as needed for muscle spasms. 09/12/16   Pricilla LovelessScott Chasin Findling, MD  HYDROcodone-acetaminophen (NORCO) 5-325 MG tablet Take 1 tablet by mouth every 6 (six) hours as needed. 09/12/16   Pricilla LovelessScott Ereka Brau, MD  ibuprofen (ADVIL,MOTRIN) 600 MG tablet Take 1 tablet (600 mg total) by mouth every 8 (eight) hours as needed. For pain Patient not taking: Reported on 09/12/2016 11/16/11   Geryl RankinsEvelyn Varnado, MD  naproxen (NAPROSYN) 500 MG tablet Take 1 tablet (500 mg total) by mouth 2 (two) times daily with a meal. 09/12/16   Pricilla LovelessScott Yiselle Babich, MD    Family History No family history on file.  Social History Social History  Substance Use Topics  . Smoking status: Never Smoker  . Smokeless tobacco: Not on file  . Alcohol use Yes     Comment: occasional     Allergies   Aspirin and Sulfa drugs cross reactors   Review of Systems Review of Systems  Constitutional: Negative for fever.  Respiratory: Negative for shortness of breath.   Cardiovascular: Negative for chest  pain.  Musculoskeletal: Positive for neck pain and neck stiffness.  Neurological: Negative for weakness and numbness.  All other systems reviewed and are negative.    Physical Exam Updated Vital Signs BP 120/70   Pulse 91   Temp 97.9 F (36.6 C) (Oral)   Resp 16   Ht 5\' 4"  (1.626 m)   Wt 250 lb (113.4 kg)   LMP 08/22/2016   SpO2 97%   BMI 42.91 kg/m   Physical Exam  Constitutional: She is oriented to person, place, and time. She appears well-developed and well-nourished.  obese  HENT:  Head: Normocephalic and atraumatic.  Right Ear: External ear  normal.  Left Ear: External ear normal.  Nose: Nose normal.  Eyes: Right eye exhibits no discharge. Left eye exhibits no discharge.  Neck: Muscular tenderness present.  Cardiovascular: Normal rate, regular rhythm and normal heart sounds.   Pulmonary/Chest: Effort normal and breath sounds normal.  Abdominal: Soft. There is no tenderness.  Musculoskeletal:       Cervical back: She exhibits tenderness and bony tenderness.       Back:  Diffuse tenderness in upper back. There is some swelling in left trapezius with tenderness but also some in right (slightly smaller), unclear if this is obesity related or true spasm  Neurological: She is alert and oriented to person, place, and time.  Skin: Skin is warm and dry. She is not diaphoretic.  Nursing note and vitals reviewed.    ED Treatments / Results  Labs (all labs ordered are listed, but only abnormal results are displayed) Labs Reviewed - No data to display  EKG  EKG Interpretation None       Radiology No results found.  Procedures Procedures (including critical care time)  Medications Ordered in ED Medications  ketorolac (TORADOL) injection 60 mg (60 mg Intramuscular Given 09/12/16 0825)  HYDROmorphone (DILAUDID) injection 2 mg (2 mg Intramuscular Given 09/12/16 0824)     Initial Impression / Assessment and Plan / ED Course  I have reviewed the triage vital signs and the nursing notes.  Pertinent labs & imaging results that were available during my care of the patient were reviewed by me and considered in my medical decision making (see chart for details).  Clinical Course as of Sep 13 939  Mon Sep 12, 2016  0802 Patient is NV intact. Afebrile. Will give IM dilaudid, IM toradol. LMP this month. No red flags on exam  [SG]  [phone] Patient currently has no pain after IM Dilaudid and IM Toradol. Most likely this is muscle spasming. Plan to discharge with Naprosyn, Flexeril, and short course of hydrocodone if these are not  working. Discussed strict return precautions. Neurovascularly intact. Highly doubt infection.  [SG]    Clinical Course User Index [SG] Pricilla Loveless, MD    I highly doubt spinal cord emergency. Patient is much better at this time. Discussed using NSAIDs and muscle relaxers as well as continue to use ice and heat. She has not had troubles ambulating and appears neurologically intact. Given no injury do not think imaging is needed at this time. Discussed strict return precautions and need for follow-up with PCP.  Final Clinical Impressions(s) / ED Diagnoses   Final diagnoses:  Muscle spasms of neck    New Prescriptions New Prescriptions   CYCLOBENZAPRINE (FLEXERIL) 10 MG TABLET    Take 1 tablet (10 mg total) by mouth 3 (three) times daily as needed for muscle spasms.   HYDROCODONE-ACETAMINOPHEN (NORCO) 5-325 MG TABLET  Take 1 tablet by mouth every 6 (six) hours as needed.   NAPROXEN (NAPROSYN) 500 MG TABLET    Take 1 tablet (500 mg total) by mouth 2 (two) times daily with a meal.     Pricilla Loveless, MD 09/12/16 [phone]

## 2016-10-19 ENCOUNTER — Inpatient Hospital Stay (HOSPITAL_COMMUNITY)
Admission: AD | Admit: 2016-10-19 | Discharge: 2016-10-20 | Disposition: A | Discharge status: Home / Self Care | Payer: 59 | Source: Ambulatory Visit | Attending: Obstetrics & Gynecology | Admitting: Obstetrics & Gynecology

## 2016-10-19 DIAGNOSIS — R300 Dysuria: Secondary | ICD-10-CM

## 2016-10-19 DIAGNOSIS — Z3202 Encounter for pregnancy test, result negative: Secondary | ICD-10-CM | POA: Insufficient documentation

## 2016-10-20 ENCOUNTER — Encounter (HOSPITAL_COMMUNITY): Payer: Self-pay | Admitting: *Deleted

## 2016-10-20 DIAGNOSIS — R300 Dysuria: Secondary | ICD-10-CM | POA: Diagnosis present

## 2016-10-20 DIAGNOSIS — Z3202 Encounter for pregnancy test, result negative: Secondary | ICD-10-CM | POA: Diagnosis not present

## 2016-10-20 LAB — URINALYSIS, ROUTINE W REFLEX MICROSCOPIC
BILIRUBIN URINE: NEGATIVE
GLUCOSE, UA: NEGATIVE mg/dL
KETONES UR: NEGATIVE mg/dL
LEUKOCYTES UA: NEGATIVE
NITRITE: NEGATIVE
PH: 7 (ref 5.0–8.0)
Protein, ur: NEGATIVE mg/dL
Specific Gravity, Urine: 1.001 — ABNORMAL LOW (ref 1.005–1.030)

## 2016-10-20 LAB — POCT PREGNANCY, URINE: Preg Test, Ur: NEGATIVE

## 2016-10-20 MED ORDER — CIPROFLOXACIN HCL 500 MG PO TABS: 500.0000 mg | ORAL_TABLET | Freq: Two times a day (BID) | ORAL | 0 refills | Status: DC

## 2016-10-20 MED ORDER — PHENAZOPYRIDINE HCL 100 MG PO TABS: 100.0000 mg | ORAL_TABLET | Freq: Three times a day (TID) | ORAL | 0 refills | Status: DC | PRN

## 2016-10-20 NOTE — Discharge Instructions (Signed)
Dysuria Dysuria is pain or discomfort while urinating. The pain or discomfort may be felt in the tube that carries urine out of the bladder (urethra) or in the surrounding tissue of the genitals. The pain may also be felt in the groin area, lower abdomen, and lower back. You may have to urinate frequently or have the sudden feeling that you have to urinate (urgency). Dysuria can affect both men and women, but is more common in women. Dysuria can be caused by many different things, including:  Urinary tract infection in women.  Infection of the kidney or bladder.  Kidney stones or bladder stones.  Certain sexually transmitted infections (STIs), such as chlamydia.  Dehydration.  Inflammation of the vagina.  Use of certain medicines.  Use of certain soaps or scented products that cause irritation. Follow these instructions at home: Watch your dysuria for any changes. The following actions may help to reduce any discomfort you are feeling:  Drink enough fluid to keep your urine clear or pale yellow.  Empty your bladder often. Avoid holding urine for long periods of time.  After a bowel movement or urination, women should cleanse from front to back, using each tissue only once.  Empty your bladder after sexual intercourse.  Take medicines only as directed by your health care provider.  If you were prescribed an antibiotic medicine, finish it all even if you start to feel better.  Avoid caffeine, tea, and alcohol. They can irritate the bladder and make dysuria worse. In men, alcohol may irritate the prostate.  Keep all follow-up visits as directed by your health care provider. This is important.  If you had any tests done to find the cause of dysuria, it is your responsibility to obtain your test results. Ask the lab or department performing the test when and how you will get your results. Talk with your health care provider if you have any questions about your results. Contact a  health care provider if:  You develop pain in your back or sides.  You have a fever.  You have nausea or vomiting.  You have blood in your urine.  You are not urinating as often as you usually do. Get help right away if:  You pain is severe and not relieved with medicines.  You are unable to hold down any fluids.  You or someone else notices a change in your mental function.  You have a rapid heartbeat at rest.  You have shaking or chills.  You feel extremely weak. This information is not intended to replace advice given to you by your health care provider. Make sure you discuss any questions you have with your health care provider. Document Released: 05/06/2004 Document Revised: 01/14/2016 Document Reviewed: 04/03/2014 Elsevier Interactive Patient Education  2017 [employer]  Urinary Frequency, Adult Urinary frequency means urinating more often than usual. People with urinary frequency urinate at least 8 times in 24 hours, even if they drink a normal amount of fluid. Although they urinate more often than normal, the total amount of urine produced in a day may be normal. Urinary frequency is also called pollakiuria. What are the causes? This condition may be caused by:  A urinary tract infection.  Obesity.  Bladder problems, such as bladder stones.  Caffeine or alcohol.  Eating food or drinking fluids that irritate the bladder. These include coffee, tea, soda, artificial sweeteners, citrus, tomato-based foods, and chocolate.  Certain medicines, such as medicines that help the body get rid of extra fluid (  diuretics).  Muscle or nerve weakness.  Overactive bladder.  Chronic diabetes.  Interstitial cystitis.  In men, problems with the prostate, such as an enlarged prostate.  In women, pregnancy. In some cases, the cause may not be known. What increases the risk? This condition is more likely to develop in:  Women who have gone through menopause.  Men with  prostate problems.  People with a disease or injury that affects the nerves or spinal cord.  People who have or have had a condition that affects the brain, such as a stroke. What are the signs or symptoms? Symptoms of this condition include:  Feeling an urgent need to urinate often. The stress and anxiety of needing to find a bathroom quickly can make this urge worse.  Urinating 8 or more times in 24 hours.  Urinating as often as every 1 to 2 hours. How is this diagnosed? This condition is diagnosed based on your symptoms, your medical history, and a physical exam. You may have tests, such as:  Blood tests.  Urine tests.  Imaging tests, such as X-rays or ultrasounds.  A bladder test.  A test of your neurological system. This is the body system that senses the need to urinate.  A test to check for problems in the urethra and bladder called cystoscopy. You may also be asked to keep a bladder diary. A bladder diary is a record of what you eat and drink, how often you urinate, and how much you urinate. You may need to see a health care provider who specializes in conditions of the urinary tract (urologist) or kidneys (nephrologist). How is this treated? Treatment for this condition depends on the cause. Sometimes the condition goes away on its own and treatment is not necessary. If treatment is needed, it may include:  Taking medicine.  Learning exercises that strengthen the muscles that help control urination.  Following a bladder training program. This may include:  Learning to delay going to the bathroom.  Double urinating (voiding). This helps if you are not completely emptying your bladder.  Scheduled voiding.  Making diet changes, such as:  Avoiding caffeine.  Drinking fewer fluids, especially alcohol.  Not drinking in the evening.  Not having foods or drinks that may irritate the bladder.  Eating foods that help prevent or ease constipation. Constipation can  make this condition worse.  Having the nerves in your bladder stimulated. There are two options for stimulating the nerves to your bladder:  Outpatient electrical nerve stimulation. This is done by your health care provider.  Surgery to implant a bladder pacemaker. The pacemaker helps to control the urge to urinate. Follow these instructions at home:  Keep a bladder diary if told to by your health care provider.  Take over-the-counter and prescription medicines only as told by your health care provider.  Do any exercises as told by your health care provider.  Follow a bladder training program as told by your health care provider.  Make any recommended diet changes.  Keep all follow-up visits as told by your health care provider. This is important. Contact a health care provider if:  You start urinating more often.  You feel pain or irritation when you urinate.  You notice blood in your urine.  Your urine looks cloudy.  You develop a fever.  You begin vomiting. Get help right away if:  You are unable to urinate. This information is not intended to replace advice given to you by your health care provider. Make  sure you discuss any questions you have with your health care provider. Document Released: 06/04/2009 Document Revised: 09/09/2015 Document Reviewed: 03/04/2015 Elsevier Interactive Patient Education  2017 [employer].

## 2016-10-20 NOTE — MAU Note (Addendum)
Pt reports frequency since last week. Pt also has R sided back pain along with some vaginal bleeding.Pt is taking Provera and this is the first time she has had irregular bleeding while on Provera. Pt has an appointment tomorrow, but started to feel worse.

## 2016-10-20 NOTE — MAU Provider Note (Signed)
History     CSN: [id number]  Arrival date and time: 10/19/16 2355   First Provider Initiated Contact with Patient 10/20/16 0054      Chief Complaint  Patient presents with  . Dysuria   Dysuria   This is a new problem. The current episode started in the past 7 days. The problem occurs every urination. The problem has been gradually worsening. Quality: pressure. The pain is at a severity of 4/10. There has been no fever. She is sexually active. There is no history of pyelonephritis. Associated symptoms include frequency, nausea (but that has improved at this time. ) and urgency. Pertinent negatives include no chills or vomiting. She has tried nothing for the symptoms.   Past Medical History:  Diagnosis Date  . Abnormal uterine bleeding   . Anxiety    panic disorder  . Dysrhythmia    sinus ventricular tachycardia per pt  . Elevated liver enzymes    no diagnosis- neg work up- intermittent elevations  . GERD (gastroesophageal reflux disease)    no meds currently  . Mental disorder    PTSD  . Shortness of breath    due to weight    Past Surgical History:  Procedure Laterality Date  . CESAREAN SECTION    . DENTAL SURGERY    . HYSTEROSCOPY W/D&C  11/16/2011   Procedure: DILATATION AND CURETTAGE /HYSTEROSCOPY;  Surgeon: Geryl RankinsEvelyn Varnado, MD;  Location: [facility];  Service: Gynecology;  Laterality: N/A;  pap smear performed    No family history on file.  Social History  Substance Use Topics  . Smoking status: Never Smoker  . Smokeless tobacco: Never Used  . Alcohol use Yes     Comment: occasional    Allergies:  Allergies  Allergen Reactions  . Aspirin Other (See Comments)    GI Intolerance  . Sulfa Drugs Cross Reactors Hives    Prescriptions Prior to Admission  Medication Sig Dispense Refill Last Dose  . docusate sodium (COLACE) 100 MG capsule Take 100 mg by mouth daily as needed for mild constipation.     Marland Kitchen. lisinopril-hydrochlorothiazide (PRINZIDE,ZESTORETIC) 20-12.5  MG per tablet Take 1 tablet by mouth daily.   10/19/2016 at 2000  . medroxyPROGESTERone (PROVERA) 5 MG tablet Take 2.5 mg by mouth See admin instructions. Take for 10 days per month (20th-30th)   10/19/2016 at 2000  . metoprolol succinate (TOPROL-XL) 100 MG 24 hr tablet Take 100 mg by mouth daily. Take with or immediately following a meal.   10/19/2016 at 2000  . acyclovir (ZOVIRAX) 400 MG tablet Take 400 mg by mouth 3 (three) times daily.  1 More than a month at Unknown time  . cyclobenzaprine (FLEXERIL) 10 MG tablet Take 1 tablet (10 mg total) by mouth 3 (three) times daily as needed for muscle spasms. 15 tablet 0 More than a month at Unknown time  . HYDROcodone-acetaminophen (NORCO) 5-325 MG tablet Take 1 tablet by mouth every 6 (six) hours as needed. 10 tablet 0 More than a month at Unknown time  . ibuprofen (ADVIL,MOTRIN) 600 MG tablet Take 1 tablet (600 mg total) by mouth every 8 (eight) hours as needed. For pain (Patient not taking: Reported on 09/12/2016) 30 tablet 0 More than a month at Unknown time  . naproxen (NAPROSYN) 500 MG tablet Take 1 tablet (500 mg total) by mouth 2 (two) times daily with a meal. 14 tablet 0 More than a month at Unknown time    Review of Systems  Constitutional: Negative for chills  and fever.  Gastrointestinal: Positive for nausea (but that has improved at this time. ). Negative for vomiting.  Genitourinary: Positive for dysuria, frequency, urgency and vaginal bleeding (period is starting today, but this is early for her. ).   Physical Exam   Blood pressure 122/73, pulse 105, temperature 98.5 F (36.9 C), temperature source Oral, resp. rate 18.  Physical Exam  Nursing note and vitals reviewed. Constitutional: She is oriented to person, place, and time. She appears well-developed and well-nourished. No distress.  HENT:  Head: Normocephalic.  Cardiovascular: Normal rate.   Respiratory: Effort normal.  GI: Soft. There is no tenderness. There is no rebound.   Genitourinary:  Genitourinary Comments: No CVA tenderness   Neurological: She is alert and oriented to person, place, and time.  Skin: Skin is warm and dry.  Psychiatric: She has a normal mood and affect.   Results for orders placed or performed during the hospital encounter of 10/19/16 (from the past 24 hour(s))  Urinalysis, Routine w reflex microscopic     Status: Abnormal   Collection Time: 10/20/16 12:15 AM  Result Value Ref Range   Color, Urine STRAW (A) YELLOW   APPearance CLEAR CLEAR   Specific Gravity, Urine 1.001 (L) 1.005 - 1.030   pH 7.0 5.0 - 8.0   Glucose, UA NEGATIVE NEGATIVE mg/dL   Hgb urine dipstick LARGE (A) NEGATIVE   Bilirubin Urine NEGATIVE NEGATIVE   Ketones, ur NEGATIVE NEGATIVE mg/dL   Protein, ur NEGATIVE NEGATIVE mg/dL   Nitrite NEGATIVE NEGATIVE   Leukocytes, UA NEGATIVE NEGATIVE   RBC / HPF 0-5 0 - 5 RBC/hpf   WBC, UA 0-5 0 - 5 WBC/hpf   Bacteria, UA RARE (A) NONE SEEN   Squamous Epithelial / LPF 0-5 (A) NONE SEEN  Pregnancy, urine POC     Status: None   Collection Time: 10/20/16 12:28 AM  Result Value Ref Range   Preg Test, Ur NEGATIVE NEGATIVE    MAU Course  Procedures  MDM   Assessment and Plan   1. Dysuria    DC home Comfort measures reviewed  UC pending  RX: cipro 500mg  BID x 5 days, pyridium as needed #10  Return to MAU as needed FU with OB as planned  Follow-up Information    [facility] Follow up.   Specialty:  Obstetrics and Gynecology Contact information: [address] [place] [address] [phone]            Tawnya Crook 10/20/2016, 12:55 AM

## 2016-10-21 LAB — URINE CULTURE

## 2017-01-11 ENCOUNTER — Ambulatory Visit: Payer: Self-pay | Admitting: Family Medicine

## 2017-11-02 ENCOUNTER — Other Ambulatory Visit (HOSPITAL_COMMUNITY)
Admission: RE | Admit: 2017-11-02 | Discharge: 2017-11-02 | Disposition: A | Discharge status: Home / Self Care | Payer: 59 | Source: Ambulatory Visit | Attending: Obstetrics and Gynecology | Admitting: Obstetrics and Gynecology

## 2017-11-02 ENCOUNTER — Other Ambulatory Visit: Payer: Self-pay | Admitting: Obstetrics and Gynecology

## 2017-11-02 DIAGNOSIS — Z01411 Encounter for gynecological examination (general) (routine) with abnormal findings: Secondary | ICD-10-CM | POA: Diagnosis not present

## 2017-11-06 LAB — CYTOLOGY - PAP
DIAGNOSIS: NEGATIVE
HPV: NOT DETECTED

## 2019-05-13 ENCOUNTER — Other Ambulatory Visit: Payer: Self-pay | Admitting: Obstetrics and Gynecology

## 2019-05-13 DIAGNOSIS — Z1231 Encounter for screening mammogram for malignant neoplasm of breast: Secondary | ICD-10-CM

## 2019-05-20 ENCOUNTER — Other Ambulatory Visit: Payer: Self-pay | Admitting: Obstetrics and Gynecology

## 2019-06-26 ENCOUNTER — Ambulatory Visit
Admission: RE | Admit: 2019-06-26 | Discharge: 2019-06-26 | Disposition: A | Discharge status: Home / Self Care | Payer: 59 | Source: Ambulatory Visit | Attending: Obstetrics and Gynecology | Admitting: Obstetrics and Gynecology

## 2019-06-26 ENCOUNTER — Other Ambulatory Visit: Payer: Self-pay

## 2019-06-26 DIAGNOSIS — Z1231 Encounter for screening mammogram for malignant neoplasm of breast: Secondary | ICD-10-CM

## 2019-07-03 ENCOUNTER — Encounter (HOSPITAL_BASED_OUTPATIENT_CLINIC_OR_DEPARTMENT_OTHER): Payer: Self-pay | Admitting: *Deleted

## 2019-07-04 ENCOUNTER — Encounter (HOSPITAL_BASED_OUTPATIENT_CLINIC_OR_DEPARTMENT_OTHER): Payer: Self-pay | Admitting: *Deleted

## 2019-07-04 ENCOUNTER — Other Ambulatory Visit: Payer: Self-pay

## 2019-07-06 ENCOUNTER — Other Ambulatory Visit (HOSPITAL_COMMUNITY)
Admission: RE | Admit: 2019-07-06 | Discharge: 2019-07-06 | Disposition: A | Discharge status: Home / Self Care | Payer: 59 | Source: Ambulatory Visit | Attending: Obstetrics and Gynecology | Admitting: Obstetrics and Gynecology

## 2019-07-06 DIAGNOSIS — Z20828 Contact with and (suspected) exposure to other viral communicable diseases: Secondary | ICD-10-CM | POA: Diagnosis not present

## 2019-07-06 DIAGNOSIS — Z01812 Encounter for preprocedural laboratory examination: Secondary | ICD-10-CM | POA: Diagnosis present

## 2019-07-07 LAB — NOVEL CORONAVIRUS, NAA (HOSP ORDER, SEND-OUT TO REF LAB; TAT 18-24 HRS): SARS-CoV-2, NAA: NOT DETECTED

## 2019-07-08 ENCOUNTER — Other Ambulatory Visit: Payer: Self-pay

## 2019-07-08 ENCOUNTER — Encounter (HOSPITAL_BASED_OUTPATIENT_CLINIC_OR_DEPARTMENT_OTHER)
Admission: RE | Admit: 2019-07-08 | Discharge: 2019-07-08 | Disposition: A | Discharge status: Home / Self Care | Payer: 59 | Source: Ambulatory Visit | Attending: Obstetrics and Gynecology | Admitting: Obstetrics and Gynecology

## 2019-07-08 DIAGNOSIS — Z01818 Encounter for other preprocedural examination: Secondary | ICD-10-CM | POA: Diagnosis present

## 2019-07-08 LAB — POCT PREGNANCY, URINE: Preg Test, Ur: NEGATIVE

## 2019-07-08 LAB — BASIC METABOLIC PANEL
Anion gap: 10 (ref 5–15)
BUN: 8 mg/dL (ref 6–20)
CO2: 25 mmol/L (ref 22–32)
Calcium: 9.2 mg/dL (ref 8.9–10.3)
Chloride: 104 mmol/L (ref 98–111)
Creatinine, Ser: 0.65 mg/dL (ref 0.44–1.00)
GFR calc Af Amer: >60 mL/min (ref >60)
GFR calc non Af Amer: >60 mL/min (ref >60)
Glucose, Bld: 80 mg/dL (ref 70–99)
Potassium: 3.8 mmol/L (ref 3.5–5.1)
Sodium: 139 mmol/L (ref 135–145)

## 2019-07-08 NOTE — Progress Notes (Signed)
Pt arrived for EKG, bloodwork, weight check, anesthesia consult. Reviewed with Dr Valma Cava who met pt and approved surgery as scheduled. Ensure presurgery drink given to pt with instruction to complete drink DOS by 1100.

## 2019-07-10 ENCOUNTER — Other Ambulatory Visit: Payer: Self-pay

## 2019-07-10 ENCOUNTER — Encounter (HOSPITAL_BASED_OUTPATIENT_CLINIC_OR_DEPARTMENT_OTHER): Payer: Self-pay

## 2019-07-10 ENCOUNTER — Ambulatory Visit (HOSPITAL_BASED_OUTPATIENT_CLINIC_OR_DEPARTMENT_OTHER)
Admission: RE | Admit: 2019-07-10 | Discharge: 2019-07-10 | Disposition: A | Discharge status: Home / Self Care | Payer: 59 | Attending: Obstetrics and Gynecology | Admitting: Obstetrics and Gynecology

## 2019-07-10 ENCOUNTER — Ambulatory Visit (HOSPITAL_BASED_OUTPATIENT_CLINIC_OR_DEPARTMENT_OTHER): Payer: 59 | Admitting: Certified Registered Nurse Anesthetist

## 2019-07-10 ENCOUNTER — Encounter (HOSPITAL_BASED_OUTPATIENT_CLINIC_OR_DEPARTMENT_OTHER)
Admission: RE | Disposition: A | Discharge status: Home / Self Care | Payer: Self-pay | Source: Home / Self Care | Attending: Obstetrics and Gynecology

## 2019-07-10 DIAGNOSIS — N921 Excessive and frequent menstruation with irregular cycle: Secondary | ICD-10-CM | POA: Diagnosis present

## 2019-07-10 DIAGNOSIS — Z8249 Family history of ischemic heart disease and other diseases of the circulatory system: Secondary | ICD-10-CM | POA: Diagnosis not present

## 2019-07-10 DIAGNOSIS — Z886 Allergy status to analgesic agent status: Secondary | ICD-10-CM | POA: Insufficient documentation

## 2019-07-10 DIAGNOSIS — Z793 Long term (current) use of hormonal contraceptives: Secondary | ICD-10-CM | POA: Insufficient documentation

## 2019-07-10 DIAGNOSIS — N8501 Benign endometrial hyperplasia: Secondary | ICD-10-CM | POA: Diagnosis not present

## 2019-07-10 DIAGNOSIS — Z79899 Other long term (current) drug therapy: Secondary | ICD-10-CM | POA: Diagnosis not present

## 2019-07-10 DIAGNOSIS — F419 Anxiety disorder, unspecified: Secondary | ICD-10-CM | POA: Diagnosis not present

## 2019-07-10 DIAGNOSIS — Z881 Allergy status to other antibiotic agents status: Secondary | ICD-10-CM | POA: Diagnosis not present

## 2019-07-10 DIAGNOSIS — K219 Gastro-esophageal reflux disease without esophagitis: Secondary | ICD-10-CM | POA: Insufficient documentation

## 2019-07-10 DIAGNOSIS — Z87891 Personal history of nicotine dependence: Secondary | ICD-10-CM | POA: Diagnosis not present

## 2019-07-10 DIAGNOSIS — I10 Essential (primary) hypertension: Secondary | ICD-10-CM | POA: Insufficient documentation

## 2019-07-10 DIAGNOSIS — Z6841 Body Mass Index (BMI) 40.0 and over, adult: Secondary | ICD-10-CM | POA: Diagnosis not present

## 2019-07-10 DIAGNOSIS — Z882 Allergy status to sulfonamides status: Secondary | ICD-10-CM | POA: Diagnosis not present

## 2019-07-10 DIAGNOSIS — F410 Panic disorder [episodic paroxysmal anxiety] without agoraphobia: Secondary | ICD-10-CM | POA: Insufficient documentation

## 2019-07-10 DIAGNOSIS — I472 Ventricular tachycardia: Secondary | ICD-10-CM | POA: Insufficient documentation

## 2019-07-10 DIAGNOSIS — F429 Obsessive-compulsive disorder, unspecified: Secondary | ICD-10-CM | POA: Insufficient documentation

## 2019-07-10 HISTORY — DX: Essential (primary) hypertension: I10

## 2019-07-10 HISTORY — PX: DILATATION & CURETTAGE/HYSTEROSCOPY WITH MYOSURE: SHX6511

## 2019-07-10 HISTORY — PX: INTRAUTERINE DEVICE (IUD) INSERTION: SHX5877

## 2019-07-10 SURGERY — DILATATION & CURETTAGE/HYSTEROSCOPY WITH MYOSURE
Anesthesia: General | Site: Vagina

## 2019-07-10 MED ORDER — IBUPROFEN 600 MG PO TABS: 600.0000 mg | ORAL_TABLET | Freq: Four times a day (QID) | ORAL | 0 refills | Status: DC | PRN

## 2019-07-10 MED ORDER — MIDAZOLAM HCL 5 MG/5ML IJ SOLN
INTRAMUSCULAR | Status: DC | PRN
  Administered 2019-07-10: 2 mg via INTRAVENOUS

## 2019-07-10 MED ORDER — PROMETHAZINE HCL 25 MG/ML IJ SOLN: 6.2500 mg | INTRAMUSCULAR | Status: DC | PRN

## 2019-07-10 MED ORDER — LIDOCAINE 2% (20 MG/ML) 5 ML SYRINGE
INTRAMUSCULAR | Status: DC | PRN
  Administered 2019-07-10: 100 mg via INTRAVENOUS

## 2019-07-10 MED ORDER — SODIUM CHLORIDE 0.9 % IR SOLN
Status: DC | PRN
  Administered 2019-07-10: 3000 mL

## 2019-07-10 MED ORDER — MIDAZOLAM HCL 2 MG/2ML IJ SOLN
INTRAMUSCULAR | Status: AC
  Filled 2019-07-10: qty 2

## 2019-07-10 MED ORDER — LIDOCAINE HCL 2 % IJ SOLN
INTRAMUSCULAR | Status: AC
  Filled 2019-07-10: qty 20

## 2019-07-10 MED ORDER — VASOPRESSIN 20 UNIT/ML IV SOLN
INTRAVENOUS | Status: AC
  Filled 2019-07-10: qty 1

## 2019-07-10 MED ORDER — SUCCINYLCHOLINE CHLORIDE 200 MG/10ML IV SOSY
PREFILLED_SYRINGE | INTRAVENOUS | Status: AC
  Filled 2019-07-10: qty 10

## 2019-07-10 MED ORDER — PROPOFOL 10 MG/ML IV BOLUS
INTRAVENOUS | Status: DC | PRN
  Administered 2019-07-10: 200 mg via INTRAVENOUS

## 2019-07-10 MED ORDER — DEXAMETHASONE SODIUM PHOSPHATE 10 MG/ML IJ SOLN
INTRAMUSCULAR | Status: DC | PRN
  Administered 2019-07-10: 10 mg via INTRAVENOUS

## 2019-07-10 MED ORDER — ONDANSETRON HCL 4 MG/2ML IJ SOLN
INTRAMUSCULAR | Status: AC
  Filled 2019-07-10: qty 2

## 2019-07-10 MED ORDER — FENTANYL CITRATE (PF) 100 MCG/2ML IJ SOLN
INTRAMUSCULAR | Status: AC
  Filled 2019-07-10: qty 2

## 2019-07-10 MED ORDER — OXYCODONE HCL 5 MG/5ML PO SOLN: 5.0000 mg | Freq: Once | ORAL | Status: DC | PRN

## 2019-07-10 MED ORDER — HYDROMORPHONE HCL 1 MG/ML IJ SOLN
INTRAMUSCULAR | Status: AC
  Filled 2019-07-10: qty 0.5

## 2019-07-10 MED ORDER — OXYCODONE HCL 5 MG PO TABS: 5.0000 mg | ORAL_TABLET | Freq: Once | ORAL | Status: DC | PRN

## 2019-07-10 MED ORDER — SILVER NITRATE-POT NITRATE 75-25 % EX MISC
CUTANEOUS | Status: AC
  Filled 2019-07-10: qty 10

## 2019-07-10 MED ORDER — FENTANYL CITRATE (PF) 100 MCG/2ML IJ SOLN
INTRAMUSCULAR | Status: DC | PRN
  Administered 2019-07-10 (×2): 25 ug via INTRAVENOUS

## 2019-07-10 MED ORDER — MEPERIDINE HCL 25 MG/ML IJ SOLN: 6.2500 mg | INTRAMUSCULAR | Status: DC | PRN

## 2019-07-10 MED ORDER — LACTATED RINGERS IV SOLN
INTRAVENOUS | Status: DC
  Administered 2019-07-10 (×2): via INTRAVENOUS

## 2019-07-10 MED ORDER — HYDROMORPHONE HCL 1 MG/ML IJ SOLN
0.2500 mg | INTRAMUSCULAR | Status: DC | PRN
  Administered 2019-07-10: 0.5 mg via INTRAVENOUS

## 2019-07-10 MED ORDER — ONDANSETRON HCL 4 MG/2ML IJ SOLN
INTRAMUSCULAR | Status: DC | PRN
  Administered 2019-07-10: 4 mg via INTRAVENOUS

## 2019-07-10 MED ORDER — PHENYLEPHRINE 40 MCG/ML (10ML) SYRINGE FOR IV PUSH (FOR BLOOD PRESSURE SUPPORT)
PREFILLED_SYRINGE | INTRAVENOUS | Status: DC | PRN
  Administered 2019-07-10: 80 ug via INTRAVENOUS

## 2019-07-10 SURGICAL SUPPLY — 28 items
BRIEF STRETCH FOR OB PAD XXL (UNDERPADS AND DIAPERS) IMPLANT
CANISTER SUCT 3000ML PPV (MISCELLANEOUS) IMPLANT
CATH ROBINSON RED A/P 14FR (CATHETERS) IMPLANT
CATH ROBINSON RED A/P 16FR (CATHETERS) IMPLANT
CLOTH BEACON ORANGE TIMEOUT ST (SAFETY) ×3 IMPLANT
DEVICE MYOSURE LITE (MISCELLANEOUS) IMPLANT
DEVICE MYOSURE REACH (MISCELLANEOUS) ×3 IMPLANT
DILATOR CANAL MILEX (MISCELLANEOUS) ×3 IMPLANT
GLOVE BIO SURGEON STRL SZ7 (GLOVE) ×3 IMPLANT
GLOVE BIOGEL PI IND STRL 6.5 (GLOVE) ×1 IMPLANT
GLOVE BIOGEL PI IND STRL 7.0 (GLOVE) ×2 IMPLANT
GLOVE BIOGEL PI INDICATOR 6.5 (GLOVE) ×2
GLOVE BIOGEL PI INDICATOR 7.0 (GLOVE) ×4
GLOVE SURG SS PI 7.0 STRL IVOR (GLOVE) ×3 IMPLANT
GOWN STRL REUS W/TWL LRG LVL3 (GOWN DISPOSABLE) ×6 IMPLANT
IV NS IRRIG 3000ML ARTHROMATIC (IV SOLUTION) ×3 IMPLANT
KIT PROCEDURE FLUENT (KITS) ×3 IMPLANT
MYOSURE XL FIBROID (MISCELLANEOUS)
PACK VAGINAL MINOR WOMEN LF (CUSTOM PROCEDURE TRAY) ×3 IMPLANT
PAD OB MATERNITY 4.3X12.25 (PERSONAL CARE ITEMS) ×6 IMPLANT
PAD PREP 24X48 CUFFED NSTRL (MISCELLANEOUS) ×3 IMPLANT
SEAL ROD LENS SCOPE MYOSURE (ABLATOR) ×3 IMPLANT
SLEEVE SCD COMPRESS KNEE MED (MISCELLANEOUS) ×3 IMPLANT
SURGILUBE 2OZ TUBE FLIPTOP (MISCELLANEOUS) IMPLANT
SYSTEM TISS REMOVAL MYOSURE XL (MISCELLANEOUS) IMPLANT
TOWEL GREEN STERILE FF (TOWEL DISPOSABLE) ×6 IMPLANT
WATER STERILE IRR 1000ML POUR (IV SOLUTION) IMPLANT
mirena ×3 IMPLANT

## 2019-07-10 NOTE — Anesthesia Procedure Notes (Signed)
Procedure Name: LMA Insertion Date/Time: 07/10/2019 2:59 PM Performed by: Genelle Bal, CRNA Pre-anesthesia Checklist: Patient identified, Emergency Drugs available, Suction available and Patient being monitored Patient Re-evaluated:Patient Re-evaluated prior to induction Oxygen Delivery Method: Circle system utilized Preoxygenation: Pre-oxygenation with 100% oxygen Induction Type: IV induction Ventilation: Mask ventilation without difficulty LMA: LMA inserted LMA Size: 4.0 Number of attempts: 1 Airway Equipment and Method: Bite block Placement Confirmation: positive ETCO2 Tube secured with: Tape Dental Injury: Teeth and Oropharynx as per pre-operative assessment

## 2019-07-10 NOTE — Interval H&P Note (Signed)
History and Physical Interval Note:  07/10/2019 2:45 PM  Sabrina Jordan  has presented today for surgery, with the diagnosis of N92.1 Menorrhagia with irregular cycle.  The various methods of treatment have been discussed with the patient and family. After consideration of risks, benefits and other options for treatment, the patient has consented to  Procedure(s) with comments: [place] (N/A) - [place] [employer] rep will cover case.  confirmed on 07/09/19 CS INTRAUTERINE DEVICE (IUD) INSERTION (N/A) as a surgical intervention.  The patient's history has been reviewed, patient examined, no change in status, stable for surgery.  I have reviewed the patient's chart and labs.  Questions were answered to the patient's satisfaction.     Thurnell Lose

## 2019-07-10 NOTE — H&P (Signed)
History of Present Illness  Isolation Precautions:  Respiratory Illness Screening  1. Is fever present / reported? No 2. Are respiratory illness symptom(s) present / reported? No 3. Are other symptom(s) present / reported? No 5. Has there been reported travel to a High Risk respiratory illness region? No 6. Has close* contact with person(s) known to have communicable illness been reported? No 7. Did travel or close contact (if applicable) occur within 14 days of symptom onset? No General:  38 y/o female presents for preoperative examination prior to Hyst/D&C and polypectomy via Myosure, Mirena IUD insertion after procedure.  EMB-Pathology 05/21/2019, endometrial polyp w/ simple hyperplasia without atypia Denies bleeding at this time. Denies h/o complications with anesthesia or bleeding. She has received preoperative clearance.   Current Medications  Taking   Lorazepam 0.5 MG Tablet 1 tablets Orally as neededDr. Abner Greenspan   Toprol XL(Metoprolol Succinate ER) 100 MG Tablet Extended Release 24 Hour 1 tablet Orally every day   Lisinopril-Hydrochlorothiazide 20-12.5 MG Tablet 1 tablet Orally every morning   Not-Taking   Acyclovir 400 mg tablet one tab orally BID, Notes: prn   Provera(medroxyPROGESTERone) 5 MG Tablet 1/2 tablet Orally Once a day x 10 days once monthly   Diflucan(Fluconazole) 150 MG Tablet 1 tablet Orally Once   Clobetasol Propionate 0.05 % Ointment 1 application to affected area Externally Twice a day to affected area prn itching   Zovirax(Acyclovir) 5 % Ointment 1 application to affected area Externally Three times a day   Medication List reviewed and reconciled with the patient    Past Medical History  SVT.   White coat hypertension.   Elevated liver enzymes.   OCD (germs).   Genital herpes.   "Skin cyst" of left breast.   Anxiety, h/o panic attacks.           Surgical History  C section   wisdom teeth removed   liver bx   D&C   hysteroscopy   Left  breast lump removed (benign) 07/2014   Family History  Father: alive, Unknown  Mother: alive, hypertension, hyperlipidemia, diabetes, diagnosed with Diabetes, Hypertension  Brother 1: diabetes  Maternal Great GM with breast cancer , 80.   Social History  General:  EXPOSURE TO PASSIVE SMOKE: quit in 2001.  Alcohol: yes, Rare.  Children: Boys, 1.  Tobacco use  cigarettes: Former smoker Quit in year 2001 Pack-year Hx: 11 Tobacco history last updated 07/03/2019 Tobacco Exposure: Rarely.  Marital Status: married.  Recreational drug use: never.  Exercise: minimal.    Gyn History  Sexual activity currently sexually active.  Periods : irregular.  LMP week of 06/17/2019.  Denies H/O Birth control.  Last pap smear date 11/02/2017-neg/HPV neg.  Last mammogram date 06/26/2019.  Denies H/O Abnormal pap smear.  Denies H/O STD.  Menarche 69.  Trying to get pregnant no.    OB History  Number of pregnancies 1.  Pregnancy # 1 live birth, C-section delivery.    Allergies  Aspirin: UPSET STOMACH - Allergy  Sulfa drugs (for allergy): HIVES - Allergy   Hospitalization/Major Diagnostic Procedure  childbirth   none this past year 04/2019   Review of Systems  See scanned ROS form for detail Denies fever/chills, chest pain, SOB, headaches, numbness/tingling. No h/o complication with anesthesia, bleeding disorders or blood clots.   Vital Signs  Wt 263.8, Wt change 4.9 lb, Ht 63, BMI 46.72, Temp 98.0, Pulse sitting 90, BP sitting 112/84.   Physical Examination  GENERAL:  Patient appears alert and oriented.  General Appearance: well-appearing, well-developed, no acute distress.  Speech: clear.  LUNGS:  Auscultation: no wheezing/rhonchi/rales. CTA bilaterally.  HEART:  Heart sounds: normal. RRR. no murmur.  ABDOMEN:  General: soft nontender, nondistended, no masses.  FEMALE GENITOURINARY:  Pelvic Not examined.  EXTREMITIES:  General: No edema or calf tenderness.  Pt aware of  scribe services today.   Assessments     1. Encounter for other preprocedural examination - Z01.818 (Primary)   2. Endometrial polyp - N84.0   3. Endometrial hyperplasia without atypia, simple - N85.01   4. Menorrhagia with irregular cycle - N92.1      Treatment  1. Encounter for other preprocedural examination  Notes: Pt counseled on R/B/A of Hyst/D&C and polypectomy via Myosure, including but not limited to infection, bleeding and injury to organs in the abdomen. Discussed recovery time. No additional pre-operative clearance needed at this time, no concerning medical Dx that may complicate the procedure. Pt informed to put nothing in the vagina for 2 weeks after procedure. Pt counseled on Mirena IUD procedures and risks including, but not limited to expulsion, bleeding, and cramping. Discussed side effects of Mirena IUD, pt advised imaging might done during insertion for assistance. Plan for f/u 2 week post op TV unless complications arise.    2. Endometrial polyp  Notes: Pt counseled on R/B/A of Hyst/D&C and polypectomy via Myosure, including but not limited to infection, bleeding and injury to organs in the abdomen. Discussed recovery time. No additional pre-operative clearance needed at this time, no concerning medical Dx that may complicate the procedure. Plan for f/u 2 week post op TV unless complications arise.    3. Endometrial hyperplasia without atypia, simple  Notes: Pt counseled on R/B/A of Hyst/D&C and polypectomy via Myosure, including but not limited to infection, bleeding and injury to organs in the abdomen. Discussed recovery time. No additional pre-operative clearance needed at this time, no concerning medical Dx that may complicate the procedure. Plan for f/u 2 week post op TV unless complications arise.    4. Menorrhagia with irregular cycle  Notes: Pt counseled on Mirena IUD procedures and risks including, but not limited to expulsion, bleeding, and cramping. Discussed  side effects of Mirena IUD, pt advised imaging might done during insertion for assistance.

## 2019-07-10 NOTE — Discharge Instructions (Addendum)
Intrauterine Device Insertion, Care After  This sheet gives you information about how to care for yourself after your procedure. Your health care provider may also give you more specific instructions. If you have problems or questions, contact your health care provider. What can I expect after the procedure? After the procedure, it is common to have:  Cramps and pain in the abdomen.  Light bleeding (spotting) or heavier bleeding that is like your menstrual period. This may last for up to a few days.  Lower back pain.  Dizziness.  Headaches.  Nausea. Follow these instructions at home:  Before resuming sexual activity, check to make sure that you can feel the IUD string(s). You should be able to feel the end of the string(s) below the opening of your cervix. If your IUD string is in place, you may resume sexual activity. ? If you had a hormonal IUD inserted more than 7 days after your most recent period started, you will need to use a backup method of birth control for 7 days after IUD insertion. Ask your health care provider whether this applies to you.  Continue to check that the IUD is still in place by feeling for the string(s) after every menstrual period, or once a month.  Take over-the-counter and prescription medicines only as told by your health care provider.  Do not drive or use heavy machinery while taking prescription pain medicine.  Keep all follow-up visits as told by your health care provider. This is important. Contact a health care provider if:  You have bleeding that is heavier or lasts longer than a normal menstrual cycle.  You have a fever.  You have cramps or abdominal pain that get worse or do not get better with medicine.  You develop abdominal pain that is new or is not in the same area of earlier cramping and pain.  You feel lightheaded or weak.  You have abnormal or bad-smelling discharge from your vagina.  You have pain during sexual  activity.  You have any of the following problems with your IUD string(s): ? The string bothers or hurts you or your sexual partner. ? You cannot feel the string. ? The string has gotten longer.  You can feel the IUD in your vagina.  You think you may be pregnant, or you miss your menstrual period.  You think you may have an STI (sexually transmitted infection). Get help right away if:  You have flu-like symptoms.  You have a fever and chills.  You can feel that your IUD has slipped out of place. Summary  After the procedure, it is common to have cramps and pain in the abdomen. It is also common to have light bleeding (spotting) or heavier bleeding that is like your menstrual period.  Continue to check that the IUD is still in place by feeling for the string(s) after every menstrual period, or once a month.  Keep all follow-up visits as told by your health care provider. This is important.  Contact your health care provider if you have problems with your IUD string(s), such as the string getting longer or bothering you or your sexual partner. This information is not intended to replace advice given to you by your health care provider. Make sure you discuss any questions you have with your health care provider. Document Released: 04/06/2011 Document Revised: 07/21/2017 Document Reviewed: 06/29/2016 Elsevier Patient Education  2020 Elsevier Inc[employer]  Dilation and Curettage or Vacuum Curettage, Care After This sheet gives you information  about how to care for yourself after your procedure. Your health care provider may also give you more specific instructions. If you have problems or questions, contact your health care provider. What can I expect after the procedure? After your procedure, it is common to have:  Mild pain or cramping.  Some vaginal bleeding or spotting. These may last for up to 2 weeks after your procedure. Follow these instructions at home: Activity   Do not  drive or use heavy machinery while taking prescription pain medicine.  Avoid driving for the first 24 hours after your procedure.  Take frequent, short walks, followed by rest periods, throughout the day. Ask your health care provider what activities are safe for you. After 1-2 days, you may be able to return to your normal activities.  Do not lift anything heavier than 10 lb (4.5 kg) until your health care provider approves.  For at least 2 weeks, or as long as told by your health care provider, do not: ? Douche. ? Use tampons. ? Have sexual intercourse. General instructions   Take over-the-counter and prescription medicines only as told by your health care provider. This is especially important if you take blood thinning medicine.  Do not take baths, swim, or use a hot tub until your health care provider approves. Take showers instead of baths.  Wear compression stockings as told by your health care provider. These stockings help to prevent blood clots and reduce swelling in your legs.  It is your responsibility to get the results of your procedure. Ask your health care provider, or the department performing the procedure, when your results will be ready.  Keep all follow-up visits as told by your health care provider. This is important. Contact a health care provider if:  You have severe cramps that get worse or that do not get better with medicine.  You have severe abdominal pain.  You cannot drink fluids without vomiting.  You develop pain in a different area of your pelvis.  You have bad-smelling vaginal discharge.  You have a rash. Get help right away if:  You have vaginal bleeding that soaks more than one sanitary pad in 1 hour, for 2 hours in a row.  You pass large blood clots from your vagina.  You have a fever that is above 100.75F (38.0C).  Your abdomen feels very tender or hard.  You have chest pain.  You have shortness of breath.  You cough up  blood.  You feel dizzy or light-headed.  You faint.  You have pain in your neck or shoulder area. This information is not intended to replace advice given to you by your health care provider. Make sure you discuss any questions you have with your health care provider. Document Released: 08/05/2000 Document Revised: 07/21/2017 Document Reviewed: 03/10/2016 Elsevier Patient Education  [place] Instructions  Activity: Get plenty of rest for the remainder of the day. A responsible individual must stay with you for 24 hours following the procedure.  For the next 24 hours, DO NOT: -Drive a car -Paediatric nurse -Drink alcoholic beverages -Take any medication unless instructed by your physician -Make any legal decisions or sign important papers.  Meals: Start with liquid foods such as gelatin or soup. Progress to regular foods as tolerated. Avoid greasy, spicy, heavy foods. If nausea and/or vomiting occur, drink only clear liquids until the nausea and/or vomiting subsides. Call your physician if vomiting continues.  Special Instructions/Symptoms: Your  throat may feel dry or sore from the anesthesia or the breathing tube placed in your throat during surgery. If this causes discomfort, gargle with warm salt water. The discomfort should disappear within 24 hours.  If you had a scopolamine patch placed behind your ear for the management of post- operative nausea and/or vomiting:  1. The medication in the patch is effective for 72 hours, after which it should be removed.  Wrap patch in a tissue and discard in the trash. Wash hands thoroughly with soap and water. 2. You may remove the patch earlier than 72 hours if you experience unpleasant side effects which may include dry mouth, dizziness or visual disturbances. 3. Avoid touching the patch. Wash your hands with soap and water after contact with the patch.

## 2019-07-10 NOTE — Brief Op Note (Signed)
07/10/2019  3:53 PM  PATIENT:  Sabrina Jordan  38 y.o. female  PRE-OPERATIVE DIAGNOSIS:  N92.1 Menorrhagia with irregular cycle, Simple endometrial hyperplasia without atypia, Endometrial polyp  POST-OPERATIVE DIAGNOSIS:  Same  PROCEDURE:  Procedure(s) with comments: [place] (N/A) - [place] Myosure rep will cover case.  confirmed on 07/09/19 CS INTRAUTERINE DEVICE (IUD) INSERTION (N/A)   SURGEON:  Surgeon(s) and Role:    Thurnell Lose, MD - Primary  PHYSICIAN ASSISTANT:   ASSISTANTS: Technician   ANESTHESIA:   general  EBL:  Minimal   BLOOD ADMINISTERED:none  DRAINS: none   LOCAL MEDICATIONS USED:  NONE  SPECIMEN:  Source of Specimen:  Endometrial currettings  DISPOSITION OF SPECIMEN:  PATHOLOGY   IMPLANTS:  Mirena IUD Lot Number:[id number],  Expiration: Dec 2022,  [facility] 48016-553-74  COUNTS:  YES  TOURNIQUET:  * No tourniquets in log *  DICTATION: .Other Dictation: Dictation Number [id number]  PLAN OF CARE: Discharge to home after PACU  PATIENT DISPOSITION:  PACU - hemodynamically stable.   Delay start of Pharmacological VTE agent (>24hrs) due to surgical blood loss or risk of bleeding: not applicable

## 2019-07-10 NOTE — Anesthesia Postprocedure Evaluation (Signed)
Anesthesia Post Note  Patient: Zoriana Oats  Procedure(s) Performed: DILATATION & CURETTAGE/HYSTEROSCOPY WITH MYOSURE (N/A Vagina ) INTRAUTERINE DEVICE (IUD) INSERTION (N/A Vagina )     Patient location during evaluation: PACU Anesthesia Type: General Level of consciousness: awake and alert Pain management: pain level controlled Vital Signs Assessment: post-procedure vital signs reviewed and stable Respiratory status: spontaneous breathing, nonlabored ventilation and respiratory function stable Cardiovascular status: blood pressure returned to baseline and stable Postop Assessment: no apparent nausea or vomiting Anesthetic complications: no    Last Vitals:  Vitals:   07/10/19 1600 07/10/19 1615  BP: 106/60 (!) 103/47  Pulse: 95 82  Resp: (!) 22 20  Temp:    SpO2: 100% 100%    Last Pain:  Vitals:   07/10/19 1554  TempSrc:   PainSc: [place]

## 2019-07-10 NOTE — Anesthesia Preprocedure Evaluation (Signed)
Anesthesia Evaluation  Patient identified by MRN, date of birth, ID band Patient awake    Reviewed: Allergy & Precautions, H&P , NPO status , Patient's Chart, lab work & pertinent test results  Airway Mallampati: III  TM Distance: >3 FB Neck ROM: Full    Dental no notable dental hx. (+) Dental Advisory Given, Chipped,    Pulmonary shortness of breath and with exertion,    Pulmonary exam normal breath sounds clear to auscultation       Cardiovascular hypertension, Pt. on medications and Pt. on home beta blockers Normal cardiovascular exam+ dysrhythmias Supra Ventricular Tachycardia  Rhythm:Regular Rate:Normal     Neuro/Psych Anxiety Mentally handicapped   GI/Hepatic GERD  Medicated and Controlled,Elevated LFT's   Endo/Other  Morbid obesity  Renal/GU negative Renal ROS  negative genitourinary   Musculoskeletal negative musculoskeletal ROS (+)   Abdominal (+) + obese,   Peds  Hematology negative hematology ROS (+)   Anesthesia Other Findings   Reproductive/Obstetrics negative OB ROS                             Anesthesia Physical  Anesthesia Plan  ASA: III  Anesthesia Plan: General   Post-op Pain Management:    Induction: Intravenous  PONV Risk Score and Plan: 3 and Ondansetron, Dexamethasone, Midazolam and Treatment may vary due to age or medical condition  Airway Management Planned: LMA  Additional Equipment:   Intra-op Plan:   Post-operative Plan: Extubation in OR  Informed Consent: I have reviewed the patients History and Physical, chart, labs and discussed the procedure including the risks, benefits and alternatives for the proposed anesthesia with the patient or authorized representative who has indicated his/her understanding and acceptance.     Dental advisory given  Plan Discussed with: CRNA, Anesthesiologist and Surgeon  Anesthesia Plan Comments:          Anesthesia Quick Evaluation

## 2019-07-10 NOTE — Transfer of Care (Signed)
Immediate Anesthesia Transfer of Care Note  Patient: Sabrina Jordan  Procedure(s) Performed: DILATATION & CURETTAGE/HYSTEROSCOPY WITH MYOSURE (N/A Vagina ) INTRAUTERINE DEVICE (IUD) INSERTION (N/A Vagina )  Patient Location: PACU  Anesthesia Type:General  Level of Consciousness: drowsy  Airway & Oxygen Therapy: Patient Spontanous Breathing and Patient connected to face mask oxygen  Post-op Assessment: Report given to RN and Post -op Vital signs reviewed and stable  Post vital signs: Reviewed and stable  Last Vitals:  Vitals Value Taken Time  BP 105/58 07/10/19 1554  Temp    Pulse 98 07/10/19 1557  Resp 21 07/10/19 1557  SpO2 100 % 07/10/19 1557  Vitals shown include unvalidated device data.  Last Pain:  Vitals:   07/10/19 1323  TempSrc: Oral  PainSc: 0-No pain      Patients Stated Pain Goal: 7 (98/33/82 [id number])  Complications: No apparent anesthesia complications

## 2019-07-11 ENCOUNTER — Encounter (HOSPITAL_BASED_OUTPATIENT_CLINIC_OR_DEPARTMENT_OTHER): Payer: Self-pay | Admitting: Obstetrics and Gynecology

## 2019-07-11 LAB — SURGICAL PATHOLOGY

## 2019-07-11 LAB — POCT HEMOGLOBIN-HEMACUE: Hemoglobin: 14.1 g/dL (ref 12.0–15.0)

## 2019-07-11 NOTE — Op Note (Signed)
[name], Mood [facility] MEDICAL RECORD [id number] ACCOUNT [id number] DATE OF BIRTH:05-01-1981 FACILITY: [facility] LOCATION: [facility] PHYSICIAN:Caprina Wussow Al Decant, MD  OPERATIVE REPORT  DATE OF PROCEDURE:  07/10/2019  PREOPERATIVE DIAGNOSES:   1.  Menorrhagia with irregular cycle.   2.  Simple endometrial hyperplasia without atypia.  3.  Endometrial polyp.  POSTOPERATIVE DIAGNOSES:   1.  Menorrhagia with irregular cycle.   2.  Simple endometrial hyperplasia without atypia.  3.  Endometrial polyp.  PROCEDURES:   1.  Hysteroscopy.   2.  Dilatation and curettage. 3.  Mirena intrauterine device insertion.  SURGEON:  Thurnell Lose, MD  ASSISTANT:  Technician.  ANESTHESIA:  General.  ESTIMATED BLOOD LOSS:  Minimal.  BLOOD ADMINISTERED:  None.  DRAINS:  None.  LOCAL:  None.  SPECIMEN:  Endometrial curettings in sock.  DISPOSITION OF SPECIMEN:  To pathology.  IMPLANTS:  Mirena IUD, Lot #[id number], expiration date 07/2021, [facility] #[id number].  PATIENT DISPOSITION:  To PACU hemodynamically stable.  COMPLICATIONS:  None.  FINDINGS:  Very anterior cervix.  Endometrial cavity with moderate amount of endometrium and polyps noted on the anterior wall.  Uterus sounded to 11 cm.  DESCRIPTION OF PROCEDURE:  The patient was identified in the holding area.  She was taken to the operating room with IV running.  She was in the dorsal supine position, underwent general anesthesia without complication.  She was then placed in the dorsal  lithotomy position and she was prepped and draped.  A timeout was performed.  SCDs were on her legs and operating.  She did not receive Ancef prior to the procedure.  Because visualizing her cervix was very difficult, we used a banana weighted speculum and a Deaver to see her cervix.  Placing that in the vagina,  I saw a very small portion of the anterior lip, grasped it with the single-tooth tenaculum and pulled it  down.  Still her cervical os was  pointed at a 45-degree angle.  However, when I saw her cervix,  I wanted to make sure that she had been adequately prepped, so Betadine was placed over the cervix.  I then used an os finder to dilate the cervix easily and  the  [facility] dilators and the hysteroscope was then advanced under direct visualization and the endometrial cavity was visualized with the findings above noted.  I then used the Reach MyoSure.  To use the MyoSure and the hysteroscope, it was all the way  to the hub and it was abutting her perineum, so we did have to put some pressure, so that the MyoSure and scope could reach to the fundus.  I was able to complete a directed or visual curettage with the MyoSure of the entire cavity.  I got all of the  polyps that appeared to be concerning.  At the end of the procedure, it appeared to be a very adequate sampling of the endometrium.  The hysteroscope and MyoSure were then removed.  I then sounded the uterus to 11 cm.  The IUD insertion device was advanced and the IUD was inserted without difficulty.  I left the strings very long, probably 6 or 7 cm because of the difficulty visualizing the patient's cervix.  All instruments were  then removed.  There was no bleeding noted at the tenaculum site.  Of note, I did not do the paracervical block.  It was difficult to see her cervix and it was omitted.  All instrument, sponge counts were correct x3.  The patient tolerated the procedure  well.  She went to the recovery room in stable condition.  VN/NUANCE  D:07/10/2019 T:07/10/2019 JOB:[id number]

## 2021-08-05 ENCOUNTER — Emergency Department (HOSPITAL_COMMUNITY): Payer: 59

## 2021-08-05 ENCOUNTER — Encounter (HOSPITAL_COMMUNITY): Payer: Self-pay

## 2021-08-05 ENCOUNTER — Emergency Department (HOSPITAL_COMMUNITY)
Admission: EM | Admit: 2021-08-05 | Discharge: 2021-08-05 | Disposition: A | Discharge status: Home / Self Care | Payer: 59 | Attending: Emergency Medicine | Admitting: Emergency Medicine

## 2021-08-05 ENCOUNTER — Other Ambulatory Visit: Payer: Self-pay

## 2021-08-05 DIAGNOSIS — I10 Essential (primary) hypertension: Secondary | ICD-10-CM | POA: Insufficient documentation

## 2021-08-05 DIAGNOSIS — W109XXA Fall (on) (from) unspecified stairs and steps, initial encounter: Secondary | ICD-10-CM | POA: Insufficient documentation

## 2021-08-05 DIAGNOSIS — Z23 Encounter for immunization: Secondary | ICD-10-CM | POA: Diagnosis not present

## 2021-08-05 DIAGNOSIS — M79604 Pain in right leg: Secondary | ICD-10-CM | POA: Diagnosis not present

## 2021-08-05 DIAGNOSIS — S82032A Displaced transverse fracture of left patella, initial encounter for closed fracture: Secondary | ICD-10-CM | POA: Insufficient documentation

## 2021-08-05 DIAGNOSIS — Z79899 Other long term (current) drug therapy: Secondary | ICD-10-CM | POA: Diagnosis not present

## 2021-08-05 DIAGNOSIS — S8992XA Unspecified injury of left lower leg, initial encounter: Secondary | ICD-10-CM | POA: Diagnosis present

## 2021-08-05 MED ORDER — OXYCODONE-ACETAMINOPHEN 5-325 MG PO TABS: 1.0000 | ORAL_TABLET | Freq: Three times a day (TID) | ORAL | 0 refills | Status: AC | PRN

## 2021-08-05 MED ORDER — TETANUS-DIPHTH-ACELL PERTUSSIS 5-2.5-18.5 LF-MCG/0.5 IM SUSY
0.5000 mL | PREFILLED_SYRINGE | Freq: Once | INTRAMUSCULAR | Status: AC
  Administered 2021-08-05: 0.5 mL via INTRAMUSCULAR
  Filled 2021-08-05: qty 0.5

## 2021-08-05 MED ORDER — HYDROMORPHONE HCL 1 MG/ML IJ SOLN
1.0000 mg | Freq: Once | INTRAMUSCULAR | Status: AC
  Administered 2021-08-05: 1 mg via INTRAVENOUS
  Filled 2021-08-05: qty 1

## 2021-08-05 MED ORDER — SODIUM CHLORIDE 0.9 % IV BOLUS: 500.0000 mL | Freq: Once | INTRAVENOUS | Status: DC

## 2021-08-05 MED ORDER — ACETAMINOPHEN 500 MG PO TABS
1000.0000 mg | ORAL_TABLET | Freq: Once | ORAL | Status: AC
  Administered 2021-08-05: 1000 mg via ORAL
  Filled 2021-08-05: qty 2

## 2021-08-05 MED ORDER — ONDANSETRON HCL 4 MG/2ML IJ SOLN
4.0000 mg | Freq: Once | INTRAMUSCULAR | Status: AC
  Administered 2021-08-05: 4 mg via INTRAVENOUS
  Filled 2021-08-05: qty 2

## 2021-08-05 NOTE — ED Notes (Signed)
Pt very anxious & tearful about discharging. She endorses worry about not being able to move her leg, husband & staff encouraging & she was able to get OOB & go to the bathroom with spousal help.

## 2021-08-05 NOTE — ED Notes (Signed)
Ortho at bedside.

## 2021-08-05 NOTE — Progress Notes (Signed)
Orthopedic Tech Progress Note Patient Details:  Sabrina Jordan Jun 10, 1981 [id number]  Ortho Devices Type of Ortho Device: Crutches, Knee Immobilizer Ortho Device/Splint Location: left Ortho Device/Splint Interventions: Ordered, Application, Adjustment   Post Interventions Patient Tolerated: Poor Instructions Provided: Adjustment of device, Care of device  Delorise Royals Josslyn Ciolek 08/05/2021, 4:54 PM Applied knee immobilizer. Delivered and showed patient how to use crutches.

## 2021-08-05 NOTE — Discharge Instructions (Signed)
You came to the emergency department today to be evaluated for your left knee pain after suffering a fall.  Your x-ray imaging showed a transverse patellar fracture.  Due to this you were placed in a knee immobilizer and given crutches to remain nonweightbearing.  Please continue to use knee immobilizer and crutches until you can follow-up with orthopedic provider.  Please call Dr. Aundria Rud with [facility] to schedule a follow-up appointment.  Today you received medications that may make you sleepy or impair your ability to make decisions.  For the next 24 hours please do not drive, operate heavy machinery, care for a small child with out another adult present, or perform any activities that may cause harm to you or someone else if you were to fall asleep or be impaired.   You are being prescribed a medication which may make you sleepy. Please follow up of listed precautions for at least 24 hours after taking one dose.  Get help right away if: You have severe pain that does not go away. You have blue or gray skin below the fracture site, or your toes on the affected side look blue or gray. You have numbness or loss of feeling below the fracture site.

## 2021-08-05 NOTE — ED Notes (Signed)
Patient transported to X-ray 

## 2021-08-05 NOTE — ED Provider Notes (Signed)
[facility] EMERGENCY DEPARTMENT Provider Note   CSN: [id number] Arrival date & time: 08/05/21  1429     History Chief Complaint  Patient presents with   Fall   Rt Leg pain    Sabrina Jordan is a 40 y.o. female with a history of SVT, anxiety, hypertension.  Presents emerged department with a chief complaint of left knee and left lower leg pain after suffering a fall.  Patient occurred approximately 4 hours prior.  Patient states that she lost her balance after slipping on a wet deck.  Patient endorses falling down 1 step.  Patient is unsure if she twisted her left lower leg or landed on her knee.  Patient reports that after her fall she has had constant pain to left knee and left shin.  Patient reports initially pain was 10/10 on the pain scale.  At present patient rates pain 3/10 on the pain scale.  Patient reports that she had improvement in pain after receiving 100 mcg of fentanyl with EMS in route.  Patient reports that pain is worse with touch or movement.  Patient endorses numbness to toes of left foot.  Patient denies any head or any loss of consciousness.  Patient is not on any blood thinners.  Patient denies any nausea, vomiting, seizure-like activity after fall.  Patient denies any neck pain, back pain, visual disturbance, patient asymmetry, dysarthria.     Fall Pertinent negatives include no chest pain, no abdominal pain, no headaches and no shortness of breath.      Past Medical History:  Diagnosis Date   Abnormal uterine bleeding    Anxiety    panic disorder   Dysrhythmia    sinus ventricular tachycardia per pt   Elevated liver enzymes    no diagnosis- neg work up- intermittent elevations   GERD (gastroesophageal reflux disease)    no meds currently   Hypertension     There are no problems to display for this patient.   Past Surgical History:  Procedure Laterality Date   CESAREAN SECTION     DENTAL SURGERY     DILATATION &  CURETTAGE/HYSTEROSCOPY WITH MYOSURE N/A 07/10/2019   Procedure: DILATATION & CURETTAGE/HYSTEROSCOPY WITH MYOSURE;  Surgeon: Geryl Rankins, MD;  Location: [facility];  Service: Gynecology;  Laterality: N/A;  [place] [employer] rep will cover case.  confirmed on 07/09/19 CS   HYSTEROSCOPY WITH D & C  11/16/2011   Procedure: DILATATION AND CURETTAGE /HYSTEROSCOPY;  Surgeon: Geryl Rankins, MD;  Location: [facility];  Service: Gynecology;  Laterality: N/A;  pap smear performed   INTRAUTERINE DEVICE (IUD) INSERTION N/A 07/10/2019   Procedure: INTRAUTERINE DEVICE (IUD) INSERTION;  Surgeon: Geryl Rankins, MD;  Location: [facility];  Service: Gynecology;  Laterality: N/A;     OB History     Gravida  2   Para      Term      Preterm      AB  1   Living  1      SAB  1   IAB      Ectopic      Multiple      Live Births  1           History reviewed. No pertinent family history.  Social History   Tobacco Use   Smoking status: Never   Smokeless tobacco: Never  Vaping Use   Vaping Use: Never used  Substance Use Topics   Alcohol use: Yes  Comment: occasional   Drug use: No    Home Medications Prior to Admission medications   Medication Sig Start Date End Date Taking? Authorizing Provider  docusate sodium (COLACE) 100 MG capsule Take 100 mg by mouth daily as needed for mild constipation.    [provider]  ibuprofen (ADVIL) 600 MG tablet Take 1 tablet (600 mg total) by mouth every 6 (six) hours as needed for moderate pain or cramping. For pain 07/10/19   Geryl Rankins, MD  lisinopril-hydrochlorothiazide (PRINZIDE,ZESTORETIC) 20-12.5 MG per tablet Take 1 tablet by mouth daily.    [provider]  metoprolol succinate (TOPROL-XL) 100 MG 24 hr tablet Take 100 mg by mouth daily. Take with or immediately following a meal.    [provider]    Allergies    Aspirin and Sulfa drugs cross reactors  Review of Systems    Review of Systems  Constitutional:  Negative for chills and fever.  HENT:  Negative for facial swelling.   Eyes:  Negative for visual disturbance.  Respiratory:  Negative for shortness of breath.   Cardiovascular:  Negative for chest pain.  Gastrointestinal:  Negative for abdominal pain, nausea and vomiting.  Genitourinary:  Negative for difficulty urinating and dysuria.  Musculoskeletal:  Positive for arthralgias and myalgias. Negative for back pain and neck pain.  Skin:  Negative for color change and rash.  Neurological:  Negative for dizziness, tremors, seizures, syncope, facial asymmetry, speech difficulty, weakness, light-headedness, numbness and headaches.  Psychiatric/Behavioral:  Negative for confusion.    Physical Exam Updated Vital Signs BP (!) 143/74    Pulse 95    Resp 19    SpO2 99%   Physical Exam Vitals and nursing note reviewed.  Constitutional:      General: She is not in acute distress.    Appearance: She is obese. She is not ill-appearing, toxic-appearing or diaphoretic.  HENT:     Head: Normocephalic and atraumatic. No raccoon eyes, Battle's sign, abrasion, contusion, right periorbital erythema, left periorbital erythema or laceration.  Eyes:     General: No scleral icterus.       Right eye: No discharge.        Left eye: No discharge.     Extraocular Movements: Extraocular movements intact.     Conjunctiva/sclera: Conjunctivae normal.     Pupils: Pupils are equal, round, and reactive to light.  Cardiovascular:     Rate and Rhythm: Normal rate.     Pulses:          Dorsalis pedis pulses are 2+ on the right side and 2+ on the left side.  Pulmonary:     Effort: Pulmonary effort is normal.  Musculoskeletal:     Cervical back: Normal range of motion. No swelling, edema, deformity, erythema, signs of trauma, lacerations, rigidity, spasms, torticollis, tenderness, bony tenderness or crepitus. No pain with movement, spinous process tenderness or muscular  tenderness. Normal range of motion.     Thoracic back: No swelling, edema, deformity, signs of trauma, lacerations, spasms, tenderness or bony tenderness.     Lumbar back: No swelling, edema, deformity, signs of trauma, lacerations, spasms, tenderness or bony tenderness.     Right knee: No swelling, deformity, effusion, erythema, ecchymosis, lacerations, bony tenderness or crepitus. Normal range of motion. No tenderness.     Left knee: Swelling, deformity, laceration and bony tenderness present. No effusion, erythema or ecchymosis. Decreased range of motion. Tenderness present over the medial joint line.     Right lower leg:  No swelling, deformity, lacerations, tenderness or bony tenderness. No edema.     Left lower leg: Laceration, tenderness and bony tenderness present. No swelling or deformity. No edema.     Right ankle: No swelling, deformity, ecchymosis or lacerations. No tenderness. Normal range of motion.     Left ankle: No swelling, deformity, ecchymosis or lacerations. No tenderness. Normal range of motion.     Right foot: Normal range of motion and normal capillary refill. No swelling, deformity, laceration, tenderness, bony tenderness or crepitus. Normal pulse.     Left foot: Normal range of motion and normal capillary refill. No swelling, deformity, laceration, tenderness, bony tenderness or crepitus. Normal pulse.     Comments: No midline tenderness or deformity to cervical, thoracic, or lumbar spine.  Patient has superficial abrasion to left shin.  Tenderness around abrasion.  Patient has swelling to medial aspect of left knee.  Tenderness to left patella and medial aspect of left knee.  Patient unable to perform range of motion secondary to complaints of pain.  No leg length discrepancy.  Pelvis stable.  No tenderness, deformity, or bony tenderness to right lower extremity and bilateral upper extremities.  Feet:     Comments: She is sensation intact to all digits of left foot.   Patient has full range of motion to all digits of left foot.  Cap refill less than 3 seconds in all digits of left foot. Skin:    General: Skin is warm and dry.  Neurological:     General: No focal deficit present.     Mental Status: She is alert.     GCS: GCS eye subscore is 4. GCS verbal subscore is 5. GCS motor subscore is 6.     Cranial Nerves: No cranial nerve deficit, dysarthria or facial asymmetry.  Psychiatric:        Behavior: Behavior is cooperative.    ED Results / Procedures / Treatments   Labs (all labs ordered are listed, but only abnormal results are displayed) Labs Reviewed - No data to display  EKG None  Radiology DG Tibia/Fibula Left  Result Date: 08/05/2021 CLINICAL DATA:  Left knee deformity after fall. EXAM: LEFT KNEE - COMPLETE 4+ VIEW; LEFT TIBIA AND FIBULA - 2 VIEW COMPARISON:  None. FINDINGS: Acute transverse fracture through the inferior patellar pole with 2.6 cm distraction. No additional fracture. Multiple well corticated ossific fragments adjacent to the medial malleolus, consistent with old injury. No dislocation. Small the joint effusion. Joint spaces are preserved. Bone mineralization is normal. Prepatellar soft tissue swelling. IMPRESSION: 1. Acute distracted transverse patellar fracture. 2. Prepatellar soft tissue swelling. Electronically Signed   By: Obie Dredge M.D.   On: 08/05/2021 15:13   DG Knee Complete 4 Views Left  Result Date: 08/05/2021 CLINICAL DATA:  Left knee deformity after fall. EXAM: LEFT KNEE - COMPLETE 4+ VIEW; LEFT TIBIA AND FIBULA - 2 VIEW COMPARISON:  None. FINDINGS: Acute transverse fracture through the inferior patellar pole with 2.6 cm distraction. No additional fracture. Multiple well corticated ossific fragments adjacent to the medial malleolus, consistent with old injury. No dislocation. Small the joint effusion. Joint spaces are preserved. Bone mineralization is normal. Prepatellar soft tissue swelling. IMPRESSION: 1.  Acute distracted transverse patellar fracture. 2. Prepatellar soft tissue swelling. Electronically Signed   By: Obie Dredge M.D.   On: 08/05/2021 15:13    Procedures Procedures   Medications Ordered in ED Medications  HYDROmorphone (DILAUDID) injection 1 mg (1 mg Intravenous Given 08/05/21 1543)  ondansetron (  ZOFRAN) injection 4 mg (4 mg Intravenous Given 08/05/21 1541)  Tdap (BOOSTRIX) injection 0.5 mL (0.5 mLs Intramuscular Given 08/05/21 1546)  HYDROmorphone (DILAUDID) injection 1 mg (1 mg Intravenous Given 08/05/21 1722)  acetaminophen (TYLENOL) tablet 1,000 mg (1,000 mg Oral Given 08/05/21 1718)    ED Course  I have reviewed the triage vital signs and the nursing notes.  Pertinent labs & imaging results that were available during my care of the patient were reviewed by me and considered in my medical decision making (see chart for details).  Clinical Course as of 08/05/21 1800  Thu Aug 05, 2021  1520 Spoke to Earney Hamburg, PA-C with orthopedics who advised to place patient in knee immobilizer and follow-up with orthopedic provider in outpatient setting. [PB]  1659 Spoke to Dr. Aundria Rud who advised to try continued pain management with discharge and  plan to follow-up in outpatient setting [PB]    Clinical Course User Index [PB] Berneice Heinrich   MDM Rules/Calculators/A&P                          Alert 40 year old female no acute distress, nontoxic-appearing.  Patient is uncomfortable due to complaints of left knee pain.  Presents to ED with chief complaint of left knee and left shin pain after suffering a mechanical fall approximately 90 minutes prior.  Patient denies hitting her head or any loss of consciousness.  Is not on any blood thinners.  No nausea, vomiting, or seizure-like activity following patient's fall.  Head is atraumatic.  No midline tenderness or deformity to cervical, thoracic, or lumbar spine.  Patient has complete range of motion of cervical  spine without complaints of pain.  Low suspicion for intracranial or spinal injury at this time.  Patient complains of pain to left knee.  Swelling noted to medial aspect of left knee.  Tenderness to medial joint line and patella.  Due to patient's body habitus unclear if patella is in an abnormal position.  Pulse, motor, and sensation intact distally.  Will obtain x-ray imaging to evaluate for acute osseous abnormality.  Patient given Dilaudid for pain management.  X-ray imaging shows patellar fracture.  We will consult orthopedic provider to discuss patient's case.    Patient was placed in knee immobilizer and able to ambulate using crutches with Ortho tech.  Patient does complain of increased pain with movement of left lower leg.  We will give patient additional pain management and reassess.  Due to continued pain will reconsult orthopedic provider.  Patient has improvement in pain.  Patient is able to ambulate using crutches with provider at bedside.  Plan to discharge patient at this time.  Patient given prescription for Percocet pain medication.  Patient given information to follow-up with orthopedic provider.  Discussed results, findings, treatment and follow up. Patient advised of return precautions. Patient verbalized understanding and agreed with plan.      Final Clinical Impression(s) / ED Diagnoses Final diagnoses:  Closed displaced transverse fracture of left patella, initial encounter    Rx / DC Orders ED Discharge Orders          Ordered    oxyCODONE-acetaminophen (PERCOCET/ROXICET) 5-325 MG tablet  Every 8 hours PRN        08/05/21 1759             Haskel Schroeder, PA-C 08/05/21 1802    Tegeler, Canary Brim, MD 08/08/21 1311

## 2021-08-05 NOTE — ED Triage Notes (Signed)
Pt BIB EMS from home d/t falling off her porch & dislocating her Lt knee. A/Ox4, rates pain 10/10 given 100 mcg Fent while en route, all other VSS.

## 2021-08-10 ENCOUNTER — Encounter (HOSPITAL_COMMUNITY): Payer: Self-pay | Admitting: Orthopedic Surgery

## 2021-08-10 ENCOUNTER — Other Ambulatory Visit: Payer: Self-pay

## 2021-08-10 NOTE — Progress Notes (Signed)
PCP - Dr. Cyndia Bent  Cardiologist - Denies  EP- Denies  Endocrine- Denies  Pulm- Denies  Chest x-ray - Denies  EKG - 08/11/21 Day of surgery  Stress Test - Denies  ECHO - Denies  Cardiac Cath - Denies  AICD- na PM- na LOOP- na  Dialysis- Denies  Sleep Study - Denies CPAP - Denies  LABS- 08/11/21   ASA- Denies  ERAS- Yes- Clears until 3 hours prior to surgery  HA1C- Denies  Anesthesia- No  Pt denies having chest pain, sob, or fever during the pre-op phone call. All instructions explained to the pt, with a verbal understanding of the material including: as of today, stop taking all Aspirin (unless instructed by your doctor) and Other Aspirin containing products, Vitamins, Fish oils, and Herbal medications. Also stop all NSAIDS i.e. Advil, Ibuprofen, Motrin, Aleve, Anaprox, Naproxen, BC, Goody Powders, and all Supplements. Pt also instructed to wear a mask and social distance if she goes out. The opportunity to ask questions was provided.    Coronavirus Screening  Have you experienced the following symptoms:  Cough yes/no: No Fever (>100.16F)  yes/no: No Runny nose yes/no: No Sore throat yes/no: No Difficulty breathing/shortness of breath  yes/no: No  Have you or a family member traveled in the last 14 days and where? yes/no: No   If the patient indicates "YES" to the above questions, their PAT will be rescheduled to limit the exposure to others and, the surgeon will be notified. THE PATIENT WILL NEED TO BE ASYMPTOMATIC FOR 14 DAYS.   If the patient is not experiencing any of these symptoms, the PAT nurse will instruct them to NOT bring anyone with them to their appointment since they may have these symptoms or traveled as well.   Please remind your patients and families that hospital visitation restrictions are in effect and the importance of the restrictions.

## 2021-08-11 ENCOUNTER — Ambulatory Visit (HOSPITAL_COMMUNITY): Payer: 59 | Admitting: Anesthesiology

## 2021-08-11 ENCOUNTER — Encounter (HOSPITAL_COMMUNITY): Payer: Self-pay | Admitting: Orthopedic Surgery

## 2021-08-11 ENCOUNTER — Ambulatory Visit (HOSPITAL_COMMUNITY)
Admission: RE | Admit: 2021-08-11 | Discharge: 2021-08-11 | Disposition: A | Discharge status: Home / Self Care | Payer: 59 | Source: Ambulatory Visit | Attending: Orthopedic Surgery | Admitting: Orthopedic Surgery

## 2021-08-11 ENCOUNTER — Encounter (HOSPITAL_COMMUNITY)
Admission: RE | Disposition: A | Discharge status: Home / Self Care | Payer: Self-pay | Source: Ambulatory Visit | Attending: Orthopedic Surgery

## 2021-08-11 ENCOUNTER — Ambulatory Visit (HOSPITAL_COMMUNITY): Payer: 59

## 2021-08-11 DIAGNOSIS — Z79899 Other long term (current) drug therapy: Secondary | ICD-10-CM | POA: Diagnosis not present

## 2021-08-11 DIAGNOSIS — W19XXXA Unspecified fall, initial encounter: Secondary | ICD-10-CM | POA: Diagnosis not present

## 2021-08-11 DIAGNOSIS — S76192A Other specified injury of left quadriceps muscle, fascia and tendon, initial encounter: Secondary | ICD-10-CM | POA: Diagnosis not present

## 2021-08-11 DIAGNOSIS — I10 Essential (primary) hypertension: Secondary | ICD-10-CM | POA: Diagnosis not present

## 2021-08-11 DIAGNOSIS — Z9189 Other specified personal risk factors, not elsewhere classified: Secondary | ICD-10-CM | POA: Insufficient documentation

## 2021-08-11 DIAGNOSIS — K219 Gastro-esophageal reflux disease without esophagitis: Secondary | ICD-10-CM | POA: Insufficient documentation

## 2021-08-11 DIAGNOSIS — S82032A Displaced transverse fracture of left patella, initial encounter for closed fracture: Secondary | ICD-10-CM | POA: Diagnosis present

## 2021-08-11 DIAGNOSIS — Z20822 Contact with and (suspected) exposure to covid-19: Secondary | ICD-10-CM | POA: Insufficient documentation

## 2021-08-11 DIAGNOSIS — I499 Cardiac arrhythmia, unspecified: Secondary | ICD-10-CM | POA: Insufficient documentation

## 2021-08-11 HISTORY — DX: Other specified postprocedural states: Z98.890

## 2021-08-11 HISTORY — PX: ORIF PATELLA: SHX5033

## 2021-08-11 HISTORY — DX: Depression, unspecified: F32.A

## 2021-08-11 HISTORY — DX: Nausea with vomiting, unspecified: R11.2

## 2021-08-11 LAB — BASIC METABOLIC PANEL
Anion gap: 13 (ref 5–15)
BUN: 15 mg/dL (ref 6–20)
CO2: 18 mmol/L — ABNORMAL LOW (ref 22–32)
Calcium: 9.1 mg/dL (ref 8.9–10.3)
Chloride: 103 mmol/L (ref 98–111)
Creatinine, Ser: 0.78 mg/dL (ref 0.44–1.00)
GFR, Estimated: >60 mL/min (ref >60)
Glucose, Bld: 103 mg/dL — ABNORMAL HIGH (ref 70–99)
Potassium: 3.8 mmol/L (ref 3.5–5.1)
Sodium: 134 mmol/L — ABNORMAL LOW (ref 135–145)

## 2021-08-11 LAB — CBC
HCT: 50.6 % — ABNORMAL HIGH (ref 36.0–46.0)
Hemoglobin: 16.5 g/dL — ABNORMAL HIGH (ref 12.0–15.0)
MCH: 30.8 pg (ref 26.0–34.0)
MCHC: 32.6 g/dL (ref 30.0–36.0)
MCV: 94.4 fL (ref 80.0–100.0)
Platelets: 286 10*3/uL (ref 150–400)
RBC: 5.36 MIL/uL — ABNORMAL HIGH (ref 3.87–5.11)
RDW: 13.2 % (ref 11.5–15.5)
WBC: 7.5 10*3/uL (ref 4.0–10.5)
nRBC: 0 % (ref 0.0–0.2)

## 2021-08-11 LAB — SARS CORONAVIRUS 2 BY RT PCR (HOSPITAL ORDER, PERFORMED IN ~~LOC~~ HOSPITAL LAB): SARS Coronavirus 2: NEGATIVE

## 2021-08-11 SURGERY — OPEN REDUCTION INTERNAL FIXATION (ORIF) PATELLA
Anesthesia: General | Site: Knee | Laterality: Left

## 2021-08-11 MED ORDER — FENTANYL CITRATE (PF) 100 MCG/2ML IJ SOLN
INTRAMUSCULAR | Status: AC
  Administered 2021-08-11: 17:00:00 100 ug via INTRAVENOUS
  Filled 2021-08-11: qty 2

## 2021-08-11 MED ORDER — FENTANYL CITRATE (PF) 100 MCG/2ML IJ SOLN
INTRAMUSCULAR | Status: AC
  Filled 2021-08-11: qty 2

## 2021-08-11 MED ORDER — ONDANSETRON 4 MG PO TBDP: 4.0000 mg | ORAL_TABLET | Freq: Three times a day (TID) | ORAL | 0 refills | Status: DC | PRN

## 2021-08-11 MED ORDER — ACETAMINOPHEN 500 MG PO TABS
1000.0000 mg | ORAL_TABLET | Freq: Once | ORAL | Status: AC
  Administered 2021-08-11: 17:00:00 1000 mg via ORAL
  Filled 2021-08-11: qty 2

## 2021-08-11 MED ORDER — PHENYLEPHRINE 40 MCG/ML (10ML) SYRINGE FOR IV PUSH (FOR BLOOD PRESSURE SUPPORT)
PREFILLED_SYRINGE | INTRAVENOUS | Status: DC | PRN
  Administered 2021-08-11: 40 ug via INTRAVENOUS

## 2021-08-11 MED ORDER — DEXAMETHASONE SODIUM PHOSPHATE 10 MG/ML IJ SOLN
INTRAMUSCULAR | Status: DC | PRN
  Administered 2021-08-11: 10 mg via INTRAVENOUS

## 2021-08-11 MED ORDER — MIDAZOLAM HCL 2 MG/2ML IJ SOLN
INTRAMUSCULAR | Status: AC
  Filled 2021-08-11: qty 2

## 2021-08-11 MED ORDER — OXYCODONE HCL 5 MG PO CAPS: 5.0000 mg | ORAL_CAPSULE | ORAL | 0 refills | Status: DC | PRN

## 2021-08-11 MED ORDER — PHENYLEPHRINE HCL (PRESSORS) 10 MG/ML IV SOLN
INTRAVENOUS | Status: DC | PRN
  Administered 2021-08-11: 80 ug via INTRAVENOUS

## 2021-08-11 MED ORDER — CEFAZOLIN SODIUM-DEXTROSE 2-4 GM/100ML-% IV SOLN
2.0000 g | INTRAVENOUS | Status: AC
  Administered 2021-08-11: 18:00:00 2 g via INTRAVENOUS
  Filled 2021-08-11: qty 100

## 2021-08-11 MED ORDER — MIDAZOLAM HCL 2 MG/2ML IJ SOLN: 2.0000 mg | Freq: Once | INTRAMUSCULAR | Status: AC

## 2021-08-11 MED ORDER — ROPIVACAINE HCL 5 MG/ML IJ SOLN
INTRAMUSCULAR | Status: DC | PRN
  Administered 2021-08-11: 30 mL via PERINEURAL

## 2021-08-11 MED ORDER — MIDAZOLAM HCL 2 MG/2ML IJ SOLN
INTRAMUSCULAR | Status: AC
  Administered 2021-08-11: 17:00:00 2 mg via INTRAVENOUS
  Filled 2021-08-11: qty 2

## 2021-08-11 MED ORDER — MIDAZOLAM HCL 2 MG/2ML IJ SOLN
INTRAMUSCULAR | Status: DC | PRN
  Administered 2021-08-11: 2 mg via INTRAVENOUS

## 2021-08-11 MED ORDER — APIXABAN 2.5 MG PO TABS: 2.5000 mg | ORAL_TABLET | Freq: Two times a day (BID) | ORAL | 0 refills | Status: DC

## 2021-08-11 MED ORDER — FENTANYL CITRATE (PF) 100 MCG/2ML IJ SOLN
25.0000 ug | INTRAMUSCULAR | Status: DC | PRN
Start: 2021-08-11 — End: 2021-08-12
  Administered 2021-08-11 (×3): 50 ug via INTRAVENOUS

## 2021-08-11 MED ORDER — AMISULPRIDE (ANTIEMETIC) 5 MG/2ML IV SOLN: 10.0000 mg | Freq: Once | INTRAVENOUS | Status: DC | PRN

## 2021-08-11 MED ORDER — FENTANYL CITRATE (PF) 100 MCG/2ML IJ SOLN: 100.0000 ug | Freq: Once | INTRAMUSCULAR | Status: AC

## 2021-08-11 MED ORDER — DEXMEDETOMIDINE (PRECEDEX) IN NS 20 MCG/5ML (4 MCG/ML) IV SYRINGE
PREFILLED_SYRINGE | INTRAVENOUS | Status: DC | PRN
  Administered 2021-08-11: 20 ug via INTRAVENOUS

## 2021-08-11 MED ORDER — PROPOFOL 10 MG/ML IV BOLUS
INTRAVENOUS | Status: DC | PRN
  Administered 2021-08-11: 200 mg via INTRAVENOUS

## 2021-08-11 MED ORDER — FENTANYL CITRATE (PF) 250 MCG/5ML IJ SOLN
INTRAMUSCULAR | Status: DC | PRN
  Administered 2021-08-11 (×3): 50 ug via INTRAVENOUS

## 2021-08-11 MED ORDER — 0.9 % SODIUM CHLORIDE (POUR BTL) OPTIME
TOPICAL | Status: DC | PRN
  Administered 2021-08-11: 18:00:00 1000 mL

## 2021-08-11 MED ORDER — CLONIDINE HCL (ANALGESIA) 100 MCG/ML EP SOLN
EPIDURAL | Status: DC | PRN
  Administered 2021-08-11: 50 ug

## 2021-08-11 MED ORDER — FENTANYL CITRATE (PF) 250 MCG/5ML IJ SOLN
INTRAMUSCULAR | Status: AC
  Filled 2021-08-11: qty 5

## 2021-08-11 MED ORDER — PROPOFOL 10 MG/ML IV BOLUS
INTRAVENOUS | Status: AC
  Filled 2021-08-11: qty 20

## 2021-08-11 MED ORDER — LIDOCAINE 2% (20 MG/ML) 5 ML SYRINGE
INTRAMUSCULAR | Status: DC | PRN
  Administered 2021-08-11: 40 mg via INTRAVENOUS

## 2021-08-11 MED ORDER — CHLORHEXIDINE GLUCONATE 0.12 % MT SOLN
15.0000 mL | OROMUCOSAL | Status: AC
  Administered 2021-08-11: 15:00:00 15 mL via OROMUCOSAL
  Filled 2021-08-11 (×2): qty 15

## 2021-08-11 MED ORDER — LACTATED RINGERS IV SOLN: INTRAVENOUS | Status: DC

## 2021-08-11 MED ORDER — ONDANSETRON HCL 4 MG/2ML IJ SOLN
INTRAMUSCULAR | Status: DC | PRN
  Administered 2021-08-11: 4 mg via INTRAVENOUS

## 2021-08-11 SURGICAL SUPPLY — 69 items
ALCOHOL 70% 16 OZ (MISCELLANEOUS) ×3 IMPLANT
BAG COUNTER SPONGE SURGICOUNT (BAG) ×2 IMPLANT
BAG SURGICOUNT SPONGE COUNTING (BAG) ×1
BNDG COHESIVE 4X5 TAN STRL (GAUZE/BANDAGES/DRESSINGS) IMPLANT
BNDG COHESIVE 6X5 TAN STRL LF (GAUZE/BANDAGES/DRESSINGS) ×3 IMPLANT
BNDG ELASTIC 4X5.8 VLCR STR LF (GAUZE/BANDAGES/DRESSINGS) IMPLANT
BNDG ELASTIC 6X5.8 VLCR STR LF (GAUZE/BANDAGES/DRESSINGS) ×2 IMPLANT
COVER SURGICAL LIGHT HANDLE (MISCELLANEOUS) ×3 IMPLANT
CUFF TOURN SGL QUICK 34 (TOURNIQUET CUFF)
CUFF TOURN SGL QUICK 42 (TOURNIQUET CUFF) ×2 IMPLANT
CUFF TRNQT CYL 34X4.125X (TOURNIQUET CUFF) IMPLANT
DRAPE C-ARM 42X72 X-RAY (DRAPES) IMPLANT
DRAPE C-ARMOR (DRAPES) ×2 IMPLANT
DRAPE IMP U-DRAPE 54X76 (DRAPES) ×3 IMPLANT
DRAPE INCISE IOBAN 66X45 STRL (DRAPES) ×3 IMPLANT
DRAPE U-SHAPE 47X51 STRL (DRAPES) IMPLANT
DRESSING AQUACEL AG SP 3.5X10 (GAUZE/BANDAGES/DRESSINGS) IMPLANT
DRSG ADAPTIC 3X8 NADH LF (GAUZE/BANDAGES/DRESSINGS) ×2 IMPLANT
DRSG AQUACEL AG SP 3.5X10 (GAUZE/BANDAGES/DRESSINGS) ×3
DURAPREP 26ML APPLICATOR (WOUND CARE) ×3 IMPLANT
ELECT CAUTERY BLADE 6.4 (BLADE) ×3 IMPLANT
ELECT REM PT RETURN 9FT ADLT (ELECTROSURGICAL) ×3
ELECTRODE REM PT RTRN 9FT ADLT (ELECTROSURGICAL) ×1 IMPLANT
GAUZE SPONGE 4X4 12PLY STRL (GAUZE/BANDAGES/DRESSINGS) ×3 IMPLANT
GAUZE XEROFORM 5X9 LF (GAUZE/BANDAGES/DRESSINGS) IMPLANT
GLOVE SRG 8 PF TXTR STRL LF DI (GLOVE) ×2 IMPLANT
GLOVE SURG ENC MOIS LTX SZ7.5 (GLOVE) ×6 IMPLANT
GLOVE SURG UNDER POLY LF SZ8 (GLOVE) ×6
GOWN STRL REUS W/ TWL LRG LVL3 (GOWN DISPOSABLE) ×1 IMPLANT
GOWN STRL REUS W/ TWL XL LVL3 (GOWN DISPOSABLE) ×2 IMPLANT
GOWN STRL REUS W/TWL LRG LVL3 (GOWN DISPOSABLE) ×3
GOWN STRL REUS W/TWL XL LVL3 (GOWN DISPOSABLE) ×6
IMMOBILIZER KNEE 20 (SOFTGOODS) ×3 IMPLANT
IMMOBILIZER KNEE 20 THIGH 36 (SOFTGOODS) IMPLANT
KIT BASIN OR (CUSTOM PROCEDURE TRAY) ×3 IMPLANT
KIT TURNOVER KIT B (KITS) ×3 IMPLANT
NDL HYPO 25GX1X1/2 BEV (NEEDLE) IMPLANT
NEEDLE HYPO 25GX1X1/2 BEV (NEEDLE) IMPLANT
NS IRRIG 1000ML POUR BTL (IV SOLUTION) ×3 IMPLANT
PACK ORTHO EXTREMITY (CUSTOM PROCEDURE TRAY) ×3 IMPLANT
PAD ARMBOARD 7.5X6 YLW CONV (MISCELLANEOUS) ×6 IMPLANT
PAD CAST 3X4 CTTN HI CHSV (CAST SUPPLIES) IMPLANT
PADDING CAST COTTON 3X4 STRL (CAST SUPPLIES)
PADDING CAST COTTON 6X4 STRL (CAST SUPPLIES) IMPLANT
PASSER SUT SWANSON 36MM LOOP (INSTRUMENTS) IMPLANT
RETRACTOR YANK SUCT EIGR SABER (INSTRUMENTS) ×2 IMPLANT
SPONGE T-LAP 18X18 ~~LOC~~+RFID (SPONGE) ×3 IMPLANT
STAPLER VISISTAT 35W (STAPLE) ×2 IMPLANT
STOCKINETTE IMPERVIOUS LG (DRAPES) ×3 IMPLANT
SUCTION FRAZIER HANDLE 10FR (MISCELLANEOUS) ×3
SUCTION TUBE FRAZIER 10FR DISP (MISCELLANEOUS) ×1 IMPLANT
SUT ETHIBOND 5 LR DA (SUTURE) ×9 IMPLANT
SUT ETHILON 2 0 FS 18 (SUTURE) IMPLANT
SUT ETHILON 3 0 PS 1 (SUTURE) IMPLANT
SUT FIBERWIRE #2 38 T-5 BLUE (SUTURE) ×3
SUT VIC AB 0 CT1 27 (SUTURE) ×3
SUT VIC AB 0 CT1 27XBRD ANBCTR (SUTURE) IMPLANT
SUT VIC AB 2-0 CT1 27 (SUTURE) ×3
SUT VIC AB 2-0 CT1 TAPERPNT 27 (SUTURE) IMPLANT
SUTURE FIBERWR #2 38 T-5 BLUE (SUTURE) IMPLANT
SYR CONTROL 10ML LL (SYRINGE) IMPLANT
SYS INTERNAL BRACE KNEE (Miscellaneous) ×3 IMPLANT
SYSTEM INTERNAL BRACE KNEE (Miscellaneous) IMPLANT
TOWEL GREEN STERILE (TOWEL DISPOSABLE) ×6 IMPLANT
TOWEL GREEN STERILE FF (TOWEL DISPOSABLE) ×3 IMPLANT
TUBE CONNECTING 12'X1/4 (SUCTIONS) ×1
TUBE CONNECTING 12X1/4 (SUCTIONS) ×2 IMPLANT
UNDERPAD 30X36 HEAVY ABSORB (UNDERPADS AND DIAPERS) ×3 IMPLANT
WATER STERILE IRR 1000ML POUR (IV SOLUTION) ×3 IMPLANT

## 2021-08-11 NOTE — Anesthesia Procedure Notes (Signed)
Procedure Name: LMA Insertion Date/Time: 08/11/2021 5:45 PM Performed by: Sheppard Evens, CRNA Pre-anesthesia Checklist: Patient identified, Emergency Drugs available, Suction available and Patient being monitored Patient Re-evaluated:Patient Re-evaluated prior to induction Oxygen Delivery Method: Circle System Utilized Preoxygenation: Pre-oxygenation with 100% oxygen Induction Type: IV induction Ventilation: Mask ventilation without difficulty LMA: LMA inserted LMA Size: 4.0 Number of attempts: 1 Airway Equipment and Method: Bite block Placement Confirmation: positive ETCO2 Tube secured with: Tape Dental Injury: Teeth and Oropharynx as per pre-operative assessment

## 2021-08-11 NOTE — Transfer of Care (Signed)
Immediate Anesthesia Transfer of Care Note  Patient: Sabrina Jordan  Procedure(s) Performed: OPEN REDUCTION INTERNAL (ORIF) FIXATION PATELLA (Left: Knee)  Patient Location: PACU  Anesthesia Type:General and Regional  Level of Consciousness: responds to stimulation  Airway & Oxygen Therapy: Patient Spontanous Breathing and Patient connected to face mask oxygen  Post-op Assessment: Report given to RN and Post -op Vital signs reviewed and stable  Post vital signs: Reviewed and stable  Last Vitals:  Vitals Value Taken Time  BP    Temp    Pulse    Resp    SpO2      Last Pain:  Vitals:   08/11/21 1533  TempSrc: Oral  PainSc:       Patients Stated Pain Goal: 2 (08/10/21 1928)  Complications: No notable events documented.

## 2021-08-11 NOTE — Discharge Instructions (Addendum)
Orthopedic discharge instructions:  -Maintain your leg in the knee immobilizer at all times.  You may remove it only while showering, or while lying flat with your knee fully supported and straighten completely you may remove it.  -With the knee immobilizer in place you may bear weight, up to 50%, on the left leg while also using either your crutches or a walker.  Again, the brace must be on an tightened and fully extended.  -Take an 81 mg aspirin once per day for 6 weeks for prevention of blood clots.  -You may remove your postoperative Ace wrap and under padding on postoperative day #3.  Maintain the postoperative bandage on the skin until your follow-up appointment in 2 weeks.  -You can shower with the postoperative bandage in place on postoperative day #3.  However, keep in mind that the knee must remain fully extended while showering and it may be easier to do sponge baths until your able to bend your knee further.  -While you are awake and with the leg fully supported you may remove/open the brace to ice the knee for 20 to 30 minutes at a time.  Do this as frequently as possible throughout the day.  -For mild to moderate pain use Tylenol and Advil in alternating fashion.  For breakthrough pain use oxycodone as necessary.  -For nausea and or vomiting use Zofran as directed.  -Follow-up with Dr. Aundria Rud in the office in 2 weeks for wound check and staple removal.

## 2021-08-11 NOTE — Anesthesia Preprocedure Evaluation (Addendum)
Anesthesia Evaluation  Patient identified by MRN, date of birth, ID band Patient awake    Reviewed: Allergy & Precautions, NPO status , Patient's Chart, lab work & pertinent test results  History of Anesthesia Complications (+) PONV  Airway Mallampati: III  TM Distance: >3 FB Neck ROM: Full    Dental  (+) Dental Advisory Given   Pulmonary neg pulmonary ROS,    breath sounds clear to auscultation       Cardiovascular hypertension, Pt. on medications + dysrhythmias  Rhythm:Regular Rate:Normal     Neuro/Psych negative neurological ROS     GI/Hepatic Neg liver ROS, GERD  ,  Endo/Other  negative endocrine ROS  Renal/GU negative Renal ROS     Musculoskeletal   Abdominal   Peds  Hematology negative hematology ROS (+)   Anesthesia Other Findings   Reproductive/Obstetrics                            Lab Results  Component Value Date   WBC 7.5 08/11/2021   HGB 16.5 (H) 08/11/2021   HCT 50.6 (H) 08/11/2021   MCV 94.4 08/11/2021   PLT 286 08/11/2021   Lab Results  Component Value Date   CREATININE 0.78 08/11/2021   BUN 15 08/11/2021   NA 134 (L) 08/11/2021   K 3.8 08/11/2021   CL 103 08/11/2021   CO2 18 (L) 08/11/2021    Anesthesia Physical Anesthesia Plan  ASA: 3  Anesthesia Plan: General   Post-op Pain Management: Regional block and Tylenol PO (pre-op)   Induction:   PONV Risk Score and Plan: 4 or greater and Scopolamine patch - Pre-op, Midazolam, Dexamethasone, Ondansetron and Treatment may vary due to age or medical condition  Airway Management Planned: LMA  Additional Equipment:   Intra-op Plan:   Post-operative Plan: Extubation in OR  Informed Consent: I have reviewed the patients History and Physical, chart, labs and discussed the procedure including the risks, benefits and alternatives for the proposed anesthesia with the patient or authorized representative who has  indicated his/her understanding and acceptance.     Dental advisory given  Plan Discussed with:   Anesthesia Plan Comments:         Anesthesia Quick Evaluation

## 2021-08-11 NOTE — H&P (Signed)
ORTHOPAEDIC H and P  REQUESTING PHYSICIAN: Yolonda Kida, MD  PCP:  Elias Else, MD  Chief Complaint: Left patella fracture  HPI: Sabrina Jordan is a 40 y.o. female who complains of left knee pain and inability to extend the knee following a fall last week.  She is here today for open reduction internal fixation of the patella.  No new complaints.  We previously saw her in the office.  Past Medical History:  Diagnosis Date   Abnormal uterine bleeding    Anxiety    panic disorder   Depression    Dysrhythmia    sinus ventricular tachycardia per pt   Elevated liver enzymes    no diagnosis- neg work up- intermittent elevations   GERD (gastroesophageal reflux disease)    no meds currently   Hypertension    PONV (postoperative nausea and vomiting)    Past Surgical History:  Procedure Laterality Date   BREAST SURGERY     lump removed from Left breast   CESAREAN SECTION     DENTAL SURGERY     DILATATION & CURETTAGE/HYSTEROSCOPY WITH MYOSURE N/A 07/10/2019   Procedure: DILATATION & CURETTAGE/HYSTEROSCOPY WITH MYOSURE;  Surgeon: Geryl Rankins, MD;  Location: [facility];  Service: Gynecology;  Laterality: N/A;  [place] [employer] rep will cover case.  confirmed on 07/09/19 CS   HYSTEROSCOPY WITH D & C  11/16/2011   Procedure: DILATATION AND CURETTAGE /HYSTEROSCOPY;  Surgeon: Geryl Rankins, MD;  Location: [facility];  Service: Gynecology;  Laterality: N/A;  pap smear performed   INTRAUTERINE DEVICE (IUD) INSERTION N/A 07/10/2019   Procedure: INTRAUTERINE DEVICE (IUD) INSERTION;  Surgeon: Geryl Rankins, MD;  Location: [facility];  Service: Gynecology;  Laterality: N/A;   Social History   Socioeconomic History   Marital status: Married    Spouse name: Not on file   Number of children: Not on file   Years of education: Not on file   Highest education level: Not on file  Occupational History   Not on file  Tobacco Use   Smoking status:  Never   Smokeless tobacco: Never  Vaping Use   Vaping Use: Never used  Substance and Sexual Activity   Alcohol use: Yes    Comment: occasional   Drug use: No   Sexual activity: Not on file  Other Topics Concern   Not on file  Social History Narrative   Not on file   Social Determinants of Health   Financial Resource Strain: Not on file  Food Insecurity: Not on file  Transportation Needs: Not on file  Physical Activity: Not on file  Stress: Not on file  Social Connections: Not on file   History reviewed. No pertinent family history. Allergies  Allergen Reactions   Sulfa Antibiotics Hives and Rash   Aspirin Other (See Comments)    GI Intolerance   Sulfa Drugs Cross Reactors Hives and Rash   Prior to Admission medications   Medication Sig Start Date End Date Taking? Authorizing Provider  lisinopril-hydrochlorothiazide (PRINZIDE,ZESTORETIC) 20-12.5 MG per tablet Take 1 tablet by mouth at bedtime.   Yes [provider]  LORazepam (ATIVAN) 0.5 MG tablet Take 0.5 mg by mouth daily as needed for anxiety.   Yes [provider]  MIRALAX 17 GM/SCOOP powder Take 17 g by mouth daily as needed for mild constipation (mix and drink as directed).   Yes [provider]  oxycodone (OXY-IR) 5 MG capsule Take 5 mg by mouth every 4 (four)  hours as needed.   Yes [provider]  TOPROL XL 100 MG 24 hr tablet Take 100 mg by mouth at bedtime. Take with or immediately following a meal.   Yes [provider]  ADVIL 200 MG CAPS Take 200-400 mg by mouth every 6 (six) hours as needed (for mild pain or headaches).    [provider]  ibuprofen (ADVIL) 600 MG tablet Take 1 tablet (600 mg total) by mouth every 6 (six) hours as needed for moderate pain or cramping. For pain Patient not taking: Reported on 08/05/2021 07/10/19   Geryl Rankins, MD   No results found.  Positive ROS: All other systems have been reviewed and were otherwise negative with the  exception of those mentioned in the HPI and as above.  Physical Exam: General: Alert, no acute distress Cardiovascular: No pedal edema Respiratory: No cyanosis, no use of accessory musculature GI: No organomegaly, abdomen is soft and non-tender Skin: No lesions in the area of chief complaint Neurologic: Sensation intact distally Psychiatric: Patient is competent for consent with normal mood and affect Lymphatic: No axillary or cervical lymphadenopathy  MUSCULOSKELETAL: Left lower extremity is swollen and bruised around the patella.  Tenderness to palpation there.  Distally neurovascular intact.  Assessment: Closed transverse left patella fracture  Plan: Plan to proceed today with open reduction internal fixation of left patella.  Possible partial patellectomy me with patella tendon repair.  We discussed the risk of bleeding, infection, damage to surrounding nerves and vessels, stiffness, failure of pain relief, malunion, nonunion, need for further surgery, DVT risk as well as risk of anesthesia.  She has provided informed consent.  -We will tentatively plan for discharge home from PACU.    Yolonda Kida, MD Cell [phone]    08/11/2021 5:18 PM

## 2021-08-11 NOTE — Op Note (Signed)
Date of Surgery: 08/11/2021  INDICATIONS: Ms. Arterburn is a 40 y.o.-year-old female with a left distal patella fracture;  The Patient did consent to the procedure after discussion of the risks and benefits.  PREOPERATIVE DIAGNOSIS:  Left patella distal transverse fracture  POSTOPERATIVE DIAGNOSIS: Left patella distal pole avulsion fracture  PROCEDURE: Left knee patella tendon repair primary  SURGEON: Maryan Rued, M.D.  ASSIST: Dion Saucier, PA-C  Assistant attestation: PA Mcclung was present for the entire procedure.  Participated in all portions..  ANESTHESIA:  general, and femoral  IV FLUIDS AND URINE: See anesthesia.  ESTIMATED BLOOD LOSS: 20 mL.  IMPLANTS:  Arthrex 4.5 mm bio composite swivel lock anchor x2 Arthrex fiber tape suture x1 Arthrex #2 FiberWire suture x1  DRAINS: none  COMPLICATIONS: None.  PREOPERATIVE INDICATIONS:   Ms. Guardado is a pleasant 40 year old female with a diagnosis of left distal pole patella fracture who elected for surgical management in order to restore the function of the extensor mechanism.     The risks benefits and alternatives were discussed with the patient preoperatively including but not limited to the risks of infection, bleeding, nerve injury, cardiopulmonary complications, the need for revision surgery, hardware prominence, hardware failure, the need for hardware removal, nonunion, malunion, posttraumatic arthritis, stiffness, loss of strength and function, among others, and the patient was willing to proceed.     OPERATIVE FINDINGS:  On preoperative assessment there appeared to be a piece of distal patella that would be adequate for fixation.  However, during intraoperative analysis it was noted that this was the very most distal aspect of the patella and was very narrow from a medial to lateral dimension.  This was essentially an avulsion of the proximal aspect of the patella tendon through this distal pole patella fracture.   Intraoperative AP and lateral demonstrated that the fractured piece was actually not containing any articular portion of the patella.  We were able to successfully repair the patella tendon back to the main fragment of the patella, and also incorporate the bony avulsed piece such that this would bolster our healing and provide some bone to bone healing.   OPERATIVE PROCEDURE: The patient was brought to the operating room and placed in the supine position. General anesthesia was administered. IV antibiotics were given. The lower extremity was prepped and draped in usual sterile fashion.  Time out was performed.    Anterior incision was made over the patella and patella tendon.  Dissection was carried down to the layer of the prepatellar retinaculum.  Proximal dissection was carried forth to the level of the tear.  We then dissected down through fracture hematoma and organized hematoma as well as the deep fascial layer and to the level of fracture and patella tendon.  The retinaculum was torn on either side.   Once the adhesions were released I then assessed the distal bone fragment.  We reduced this with a large Weber clamp.  We then obtained AP and lateral x-rays.  What we noted on this was that the fragment was very narrow from medial to lateral and not likely adequate for any internal fixation, and also there was no articular cartilage on the distal fragment.  Essentially, this was a avulsion of the distal pole through the patella tendon.     No further intraoperative fluoroscopy was utilized.  We then utilized to Arthrex fiber tape sutures to run a Krakw running locking suture figuration on the medial half up and down to the tendon stump.  This left knee with 2 suture limbs.   Next we prepared the inferior pole of the patella.  We biologically prepared the interface of the distal and proximal aspects of the bony beds.  This was accomplished with rondure as well as deep #15 blade knife and normal  saline lavage.  All devitalized tissue was removed.  We next used rondure your to biologically prepare the superior pole bone.   Next we moved to secure the suture limbs into the superior pole of the patella.  We prepared for the placement of 2 parallel 4.75 mm Arthrex swivel lock anchors.  Drill pins were placed.  We then overdrilled with the appropriate cannulated drill bit to a depth of 20 mm.  We next used the hand tap to prepare the drill sockets.       These were secured into the bone and had excellent purchase.  Next to the free limbs of the suture tapes as well as the free limbs of the suture anchor #2 FiberWire were then ran back into the tendon in a horizontal mattress configuration and tied down to completely secure the tendon to bone interface.  This was repeated on both the medial and lateral side.   We then moved to close the retinacular tissue.  This was performed with a #2 FiberWire and a running locking fashion.  Following complete closure of the extensor mechanism and the retinaculum I was able to passively flex the knee to about 30 degrees with no gapping noted at the tendon bone interface.   The wounds were irrigated copiously.  The deep fat layer was closed with a running 0 Vicryl.  Deep dermal tissue was closed with interrupted 2-0 Vicryl.  Skin was reapproximated and closed with staples.  Standard sterile bandages were applied.  Patient was placed in a knee immobilizer, in full extension. The patient was awakened and returned to the PACU in stable and satisfactory condition. There were no complications.      Disposition:   Patient was transferred to PACU in stable condition.  She will be 50% weightbearing to the left lower extremity with the leg locked in full extension.  She will discharge home from PACU.  81 mg of aspirin once per day x6 weeks for DVT prophylaxis.  Follow-up with me in the office in 2 weeks with AP and lateral x-rays.  We will transition her into a Bledsoe style  brace at the 2-week follow-up.

## 2021-08-11 NOTE — Progress Notes (Signed)
Pt unable to provide urine sample for UPT, Dr. Sampson Goon ok to proceed without UPT - pt is confident there is no chance she could be pregnant. Pt also has IUD in place.

## 2021-08-11 NOTE — Anesthesia Procedure Notes (Signed)
Anesthesia Regional Block: Femoral nerve block   Pre-Anesthetic Checklist: , timeout performed,  Correct Patient, Correct Site, Correct Laterality,  Correct Procedure, Correct Position, site marked,  Risks and benefits discussed,  Surgical consent,  Pre-op evaluation,  At surgeon's request and post-op pain management  Laterality: Left  Prep: chloraprep       Needles:  Injection technique: Single-shot  Needle Type: Echogenic Stimulator Needle     Needle Length: 9cm  Needle Gauge: 21     Additional Needles:   Procedures:, nerve stimulator,,, ultrasound used (permanent image in chart),,     Nerve Stimulator or Paresthesia:  Response: quadriceps, 0.5 mA  Additional Responses:   Narrative:  Start time: 08/11/2021 4:50 PM End time: 08/11/2021 4:59 PM Injection made incrementally with aspirations every 5 mL.  Performed by: Personally  Anesthesiologist: Marcene Duos, MD

## 2021-08-11 NOTE — Brief Op Note (Signed)
08/11/2021  6:44 PM  PATIENT:  Sabrina Jordan  40 y.o. female  PRE-OPERATIVE DIAGNOSIS:  Left patella Fracture  POST-OPERATIVE DIAGNOSIS:  Left patella distal pole avulsion fracture  PROCEDURE:  Procedure(s) with comments: Left patella tendon repair  SURGEON:  Surgeon(s) and Role:    * Aundria Rud, Noah Delaine, MD - Primary  PHYSICIAN ASSISTANT: Dion Saucier, PA-C  ANESTHESIA:   regional and general  EBL: 20 cc  BLOOD ADMINISTERED:none  DRAINS: none   LOCAL MEDICATIONS USED:  NONE  SPECIMEN:  No Specimen  DISPOSITION OF SPECIMEN:  N/A  COUNTS:  YES  TOURNIQUET:  * Missing tourniquet times found for documented tourniquets in log: [id number] *  DICTATION: .Note written in EPIC  PLAN OF CARE: Discharge to home after PACU  PATIENT DISPOSITION:  PACU - hemodynamically stable.   Delay start of Pharmacological VTE agent (>24hrs) due to surgical blood loss or risk of bleeding: not applicable

## 2021-08-12 NOTE — Anesthesia Postprocedure Evaluation (Signed)
Anesthesia Post Note  Patient: Ebone Alcivar  Procedure(s) Performed: OPEN REDUCTION INTERNAL (ORIF) FIXATION PATELLA (Left: Knee)     Patient location during evaluation: PACU Anesthesia Type: General Level of consciousness: awake and alert Pain management: pain level controlled Vital Signs Assessment: post-procedure vital signs reviewed and stable Respiratory status: spontaneous breathing, nonlabored ventilation and respiratory function stable Cardiovascular status: stable and blood pressure returned to baseline Anesthetic complications: no   No notable events documented.  Last Vitals:  Vitals:   08/11/21 2000 08/11/21 2015  BP: 119/73 122/68  Pulse: 92 90  Resp: 18 18  Temp:  36.5 C  SpO2: 92% 94%    Last Pain:  Vitals:   08/11/21 2015  TempSrc:   PainSc: Asleep                 Beryle Lathe

## 2021-08-13 ENCOUNTER — Encounter (HOSPITAL_COMMUNITY): Payer: Self-pay | Admitting: Orthopedic Surgery

## 2022-03-17 ENCOUNTER — Other Ambulatory Visit: Payer: Self-pay | Admitting: Pediatrics

## 2022-03-17 DIAGNOSIS — Z1231 Encounter for screening mammogram for malignant neoplasm of breast: Secondary | ICD-10-CM

## 2022-06-22 ENCOUNTER — Ambulatory Visit
Admission: RE | Admit: 2022-06-22 | Discharge: 2022-06-22 | Disposition: A | Discharge status: Home / Self Care | Payer: 59 | Source: Ambulatory Visit | Attending: Pediatrics | Admitting: Pediatrics

## 2022-06-22 DIAGNOSIS — Z1231 Encounter for screening mammogram for malignant neoplasm of breast: Secondary | ICD-10-CM

## 2022-12-10 ENCOUNTER — Emergency Department (HOSPITAL_COMMUNITY)
Admission: EM | Admit: 2022-12-10 | Discharge: 2022-12-10 | Disposition: A | Discharge status: Home / Self Care | Payer: 59 | Attending: Emergency Medicine | Admitting: Emergency Medicine

## 2022-12-10 ENCOUNTER — Encounter (HOSPITAL_COMMUNITY): Payer: Self-pay

## 2022-12-10 ENCOUNTER — Emergency Department (HOSPITAL_BASED_OUTPATIENT_CLINIC_OR_DEPARTMENT_OTHER): Payer: 59

## 2022-12-10 ENCOUNTER — Other Ambulatory Visit: Payer: Self-pay

## 2022-12-10 DIAGNOSIS — M25561 Pain in right knee: Secondary | ICD-10-CM | POA: Diagnosis not present

## 2022-12-10 DIAGNOSIS — M79661 Pain in right lower leg: Secondary | ICD-10-CM | POA: Diagnosis not present

## 2022-12-10 DIAGNOSIS — Z7901 Long term (current) use of anticoagulants: Secondary | ICD-10-CM | POA: Diagnosis not present

## 2022-12-10 DIAGNOSIS — M79604 Pain in right leg: Secondary | ICD-10-CM | POA: Diagnosis present

## 2022-12-10 LAB — CBC WITH DIFFERENTIAL/PLATELET
Abs Immature Granulocytes: 0.03 10*3/uL (ref 0.00–0.07)
Basophils Absolute: 0 10*3/uL (ref 0.0–0.1)
Basophils Relative: 1 %
Eosinophils Absolute: 0.2 10*3/uL (ref 0.0–0.5)
Eosinophils Relative: 2 %
HCT: 47.9 % — ABNORMAL HIGH (ref 36.0–46.0)
Hemoglobin: 16.2 g/dL — ABNORMAL HIGH (ref 12.0–15.0)
Immature Granulocytes: 0 %
Lymphocytes Relative: 31 %
Lymphs Abs: 2.6 10*3/uL (ref 0.7–4.0)
MCH: 31.5 pg (ref 26.0–34.0)
MCHC: 33.8 g/dL (ref 30.0–36.0)
MCV: 93 fL (ref 80.0–100.0)
Monocytes Absolute: 0.6 10*3/uL (ref 0.1–1.0)
Monocytes Relative: 7 %
Neutro Abs: 4.9 10*3/uL (ref 1.7–7.7)
Neutrophils Relative %: 59 %
Platelets: 265 10*3/uL (ref 150–400)
RBC: 5.15 MIL/uL — ABNORMAL HIGH (ref 3.87–5.11)
RDW: 13.3 % (ref 11.5–15.5)
WBC: 8.3 10*3/uL (ref 4.0–10.5)
nRBC: 0 % (ref 0.0–0.2)

## 2022-12-10 LAB — BASIC METABOLIC PANEL
Anion gap: 9 (ref 5–15)
BUN: 11 mg/dL (ref 6–20)
CO2: 23 mmol/L (ref 22–32)
Calcium: 8.8 mg/dL — ABNORMAL LOW (ref 8.9–10.3)
Chloride: 103 mmol/L (ref 98–111)
Creatinine, Ser: 0.69 mg/dL (ref 0.44–1.00)
GFR, Estimated: >60 mL/min (ref >60)
Glucose, Bld: 97 mg/dL (ref 70–99)
Potassium: 3.4 mmol/L — ABNORMAL LOW (ref 3.5–5.1)
Sodium: 135 mmol/L (ref 135–145)

## 2022-12-10 NOTE — ED Provider Notes (Signed)
[place] EMERGENCY DEPARTMENT AT [facility] Provider Note   CSN: [id number] Arrival date & time: 12/10/22  1513     History  Chief Complaint  Patient presents with   Leg Pain    Sabrina Jordan is a 42 y.o. female.  She reports that she was in [place] earlier this week patient reports that she traveled there by car.  Patient reports that she did do increased walking and increased activity while she was there.  Patient reports that yesterday she began having swelling to her right knee and began having pain.  Patient was seen at [facility] urgent care and they were concerned that patient had risk for DVT.  Patient has had recent travel and is on hormones.  Patient was advised to come to the emergency department for evaluation.  Patient did have an x-ray at [facility].  Patient denies any fever or chills she denies any shortness of breath she has not had any chest pain   Leg Pain      Home Medications Prior to Admission medications   Medication Sig Start Date End Date Taking? Authorizing Provider  ADVIL 200 MG CAPS Take 200-400 mg by mouth every 6 (six) hours as needed (for mild pain or headaches).    [provider]  apixaban (ELIQUIS) 2.5 MG TABS tablet Take 1 tablet (2.5 mg total) by mouth 2 (two) times daily for 28 days. 08/11/21 09/08/21  Yolonda Kida, MD  ibuprofen (ADVIL) 600 MG tablet Take 1 tablet (600 mg total) by mouth every 6 (six) hours as needed for moderate pain or cramping. For pain Patient not taking: Reported on 08/05/2021 07/10/19   Geryl Rankins, MD  lisinopril-hydrochlorothiazide (PRINZIDE,ZESTORETIC) 20-12.5 MG per tablet Take 1 tablet by mouth at bedtime.    [provider]  LORazepam (ATIVAN) 0.5 MG tablet Take 0.5 mg by mouth daily as needed for anxiety.    [provider]  MIRALAX 17 GM/SCOOP powder Take 17 g by mouth daily as needed for mild constipation (mix and drink as directed).    [provider]   ondansetron (ZOFRAN-ODT) 4 MG disintegrating tablet Take 1 tablet (4 mg total) by mouth every 8 (eight) hours as needed for nausea or vomiting. 08/11/21   Yolonda Kida, MD  oxycodone (OXY-IR) 5 MG capsule Take 1 capsule (5 mg total) by mouth every 4 (four) hours as needed. 08/11/21   Yolonda Kida, MD  TOPROL XL 100 MG 24 hr tablet Take 100 mg by mouth at bedtime. Take with or immediately following a meal.    [provider]      Allergies    Sulfa antibiotics, Aspirin, and Sulfa drugs cross reactors    Review of Systems   Review of Systems  All other systems reviewed and are negative.   Physical Exam Updated Vital Signs BP 126/67   Pulse 80   Temp 98.8 F (37.1 C) (Oral)   Resp 18   Ht 5\' 2"  (1.575 m)   Wt 113.4 kg   SpO2 100%   BMI 45.73 kg/m  Physical Exam Vitals and nursing note reviewed.  Constitutional:      Appearance: She is well-developed.  HENT:     Head: Normocephalic.  Cardiovascular:     Rate and Rhythm: Normal rate.  Pulmonary:     Effort: Pulmonary effort is normal.  Abdominal:     General: There is no distension.  Musculoskeletal:        General: Swelling and  tenderness present.     Cervical back: Normal range of motion.     Comments: Positive Homans swollen right leg compared with the left.  Approximately 2 cm area of swelling upper lateral leg.  Pain with range of motion of knee neurovascular neurosensory are intact  Skin:    General: Skin is warm.  Neurological:     Mental Status: She is alert and oriented to person, place, and time.  Psychiatric:        Mood and Affect: Mood normal.     ED Results / Procedures / Treatments   Labs (all labs ordered are listed, but only abnormal results are displayed) Labs Reviewed  CBC WITH DIFFERENTIAL/PLATELET - Abnormal; Notable for the following components:      Result Value   RBC 5.15 (*)    Hemoglobin 16.2 (*)    HCT 47.9 (*)    All other components within normal limits   BASIC METABOLIC PANEL - Abnormal; Notable for the following components:   Potassium 3.4 (*)    Calcium 8.8 (*)    All other components within normal limits    EKG None  Radiology VAS [place] LOWER EXTREMITY VENOUS (DVT)  Result Date: 12/10/2022  Lower Venous DVT Study Patient Name:  Sabrina Jordan  Date of Exam:   12/10/2022 Medical Rec #: [id number]         Accession #:    [id number] Date of Birth: 02-07-1981          Patient Gender: F Patient Age:   25 years Exam Location:  [facility] Procedure:      VAS [place] LOWER EXTREMITY VENOUS (DVT) Referring Phys: Vivien Rossetti --------------------------------------------------------------------------------  Indications: Right knee pain and swelling. Other Indications: Focal area of palpation- anterolateral aspect of knee. Risk Factors: Recent extended travel. Comparison Study: No prior studies. Performing Technologist: Jean Rosenthal RDMS, RVT  Examination Guidelines: A complete evaluation includes B-mode imaging, spectral Doppler, color Doppler, and power Doppler as needed of all accessible portions of each vessel. Bilateral testing is considered an integral part of a complete examination. Limited examinations for reoccurring indications may be performed as noted. The reflux portion of the exam is performed with the patient in reverse Trendelenburg.  +---------+---------------+---------+-----------+----------+--------------+ RIGHT    CompressibilityPhasicitySpontaneityPropertiesThrombus Aging +---------+---------------+---------+-----------+----------+--------------+ CFV      Full           Yes      Yes                                 +---------+---------------+---------+-----------+----------+--------------+ SFJ      Full                                                        +---------+---------------+---------+-----------+----------+--------------+ FV Prox  Full                                                         +---------+---------------+---------+-----------+----------+--------------+ FV Mid   Full                                                        +---------+---------------+---------+-----------+----------+--------------+  FV DistalFull                                                        +---------+---------------+---------+-----------+----------+--------------+ PFV      Full                                                        +---------+---------------+---------+-----------+----------+--------------+ POP      Full           Yes      Yes                                 +---------+---------------+---------+-----------+----------+--------------+ PTV      Full                                                        +---------+---------------+---------+-----------+----------+--------------+ PERO     Full                                                        +---------+---------------+---------+-----------+----------+--------------+ Gastroc  Full                                                        +---------+---------------+---------+-----------+----------+--------------+   +----+---------------+---------+-----------+----------+--------------+ LEFTCompressibilityPhasicitySpontaneityPropertiesThrombus Aging +----+---------------+---------+-----------+----------+--------------+ CFV Full           Yes      Yes                                 +----+---------------+---------+-----------+----------+--------------+    Summary: RIGHT: - There is no evidence of deep vein thrombosis in the lower extremity.  - No cystic structure found in the popliteal fossa. - Fluid collection noted at lateral aspect of right knee/area of palpation.  LEFT: - No evidence of common femoral vein obstruction.  *See table(s) above for measurements and observations. Electronically signed by Sherald Hess MD on 12/10/2022 at 5:05:41 PM.    Final     Procedures Procedures     Medications Ordered in ED Medications - No data to display  ED Course/ Medical Decision Making/ A&P                             Medical Decision Making Patient complains of pain in her right leg.  Patient was sent here for evaluation for possible DVT after being seen at the orthopedic urgent care  Amount and/or Complexity of Data Reviewed Independent Historian: spouse    Details: Is here with her husband who is supportive External  Data Reviewed: notes.    Details: Orthopedist notes reviewed Labs: ordered. Decision-making details documented in ED Course.    Details: Labs ordered reviewed and interpreted.  Patient has a normal white blood cell count Radiology: ordered and independent interpretation performed. Decision-making details documented in ED Course.    Details: [place] ultrasound shows no evidence of DVT  Risk OTC drugs. Risk Details: Patient is counseled on results she is advised to follow-up with her orthopedist Dr. Aundria Rud next week patient is placed in a knee immobilizer for comfort patient declines prescription for pain medicine or anti-inflammatory.  Patient states that she will take over-the-counter ibuprofen.  Patient is advised to return if any problems           Final Clinical Impression(s) / ED Diagnoses Final diagnoses:  Acute pain of right knee    Rx / DC Orders ED Discharge Orders     None      An After Visit Summary was printed and given to the patient.    Osie Cheeks 12/10/22 1730    Mardene Sayer, MD 12/10/22 2329

## 2022-12-10 NOTE — ED Triage Notes (Signed)
Pt arrives with c/o right leg pain that started 2 days ago. Per pt, the pain is on the backside of her right knee. Per pt, the pain has gotten worse and is painful to ambulate. Pts knee is swollen. Pt denies SOB or CP.

## 2022-12-10 NOTE — Discharge Instructions (Signed)
Ice to area of swelling.  Knee immbolizer while walking. Ibuprofen for discomfort

## 2022-12-10 NOTE — Progress Notes (Signed)
Lower extremity venous right study completed.  Preliminary results relayed to [place], MD.   See CV Proc for preliminary results report.   Silvia Markuson, RDMS, RVT  

## 2023-05-22 ENCOUNTER — Other Ambulatory Visit: Payer: Self-pay | Admitting: Obstetrics and Gynecology

## 2023-05-22 DIAGNOSIS — Z1231 Encounter for screening mammogram for malignant neoplasm of breast: Secondary | ICD-10-CM

## 2023-06-28 ENCOUNTER — Ambulatory Visit: Payer: 59

## 2023-07-12 ENCOUNTER — Ambulatory Visit
Admission: RE | Admit: 2023-07-12 | Discharge: 2023-07-12 | Disposition: A | Discharge status: Home / Self Care | Payer: 59 | Source: Ambulatory Visit | Attending: Obstetrics and Gynecology

## 2023-07-12 DIAGNOSIS — Z1231 Encounter for screening mammogram for malignant neoplasm of breast: Secondary | ICD-10-CM

## 2023-07-18 ENCOUNTER — Ambulatory Visit: Payer: 59

## 2023-09-21 LAB — CYTOLOGY - PAP

## 2023-10-21 IMAGING — CR DG KNEE COMPLETE 4+V*L*
4 series · 4 of 4 positions shown · non-contrast
Comparison: None.

CLINICAL DATA: Left knee deformity after fall.

EXAM:
LEFT KNEE - COMPLETE 4+ VIEW; LEFT TIBIA AND FIBULA - 2 VIEW

[knee ap]
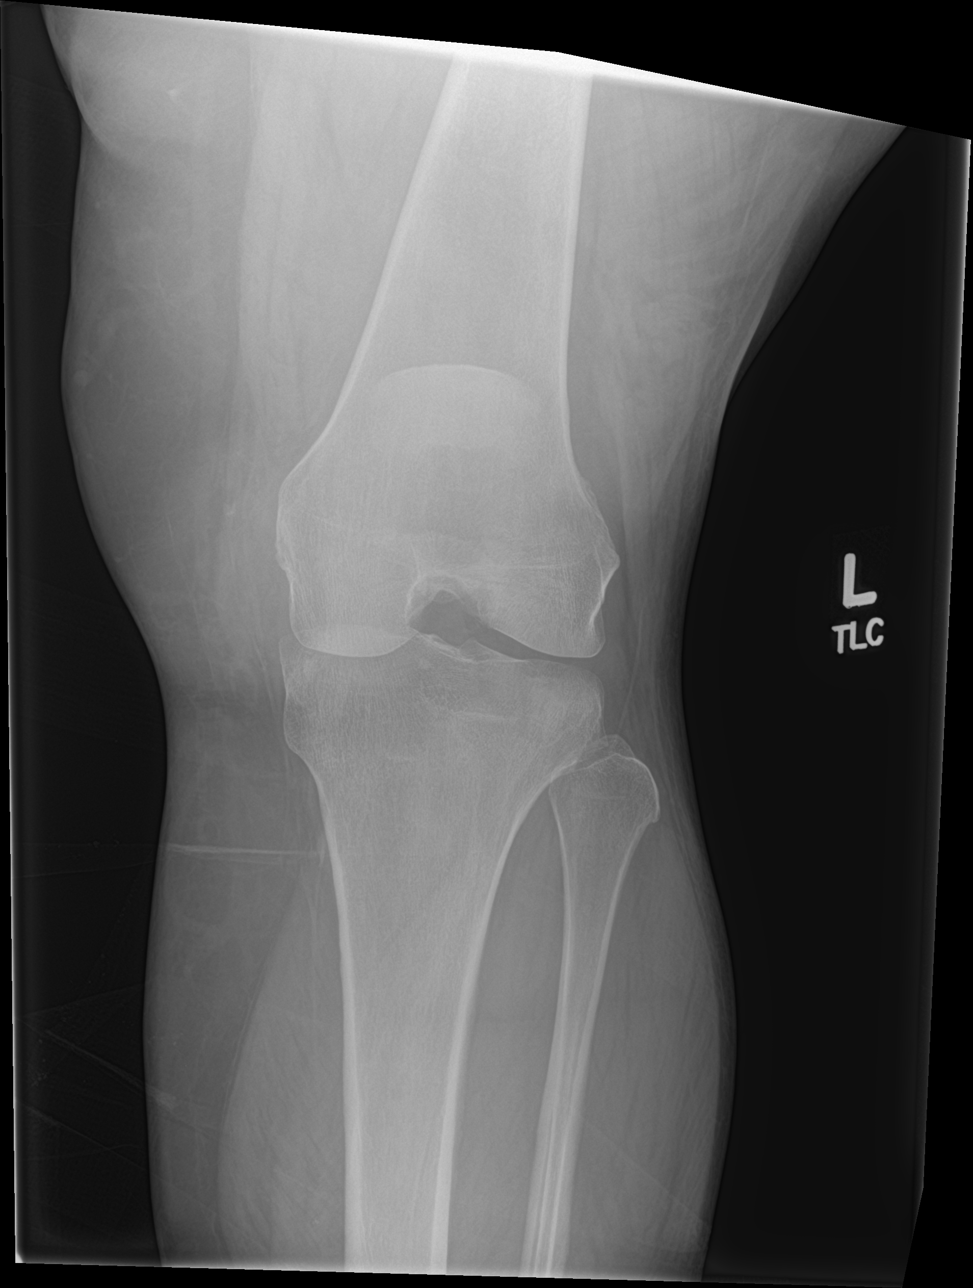

[knee lat]
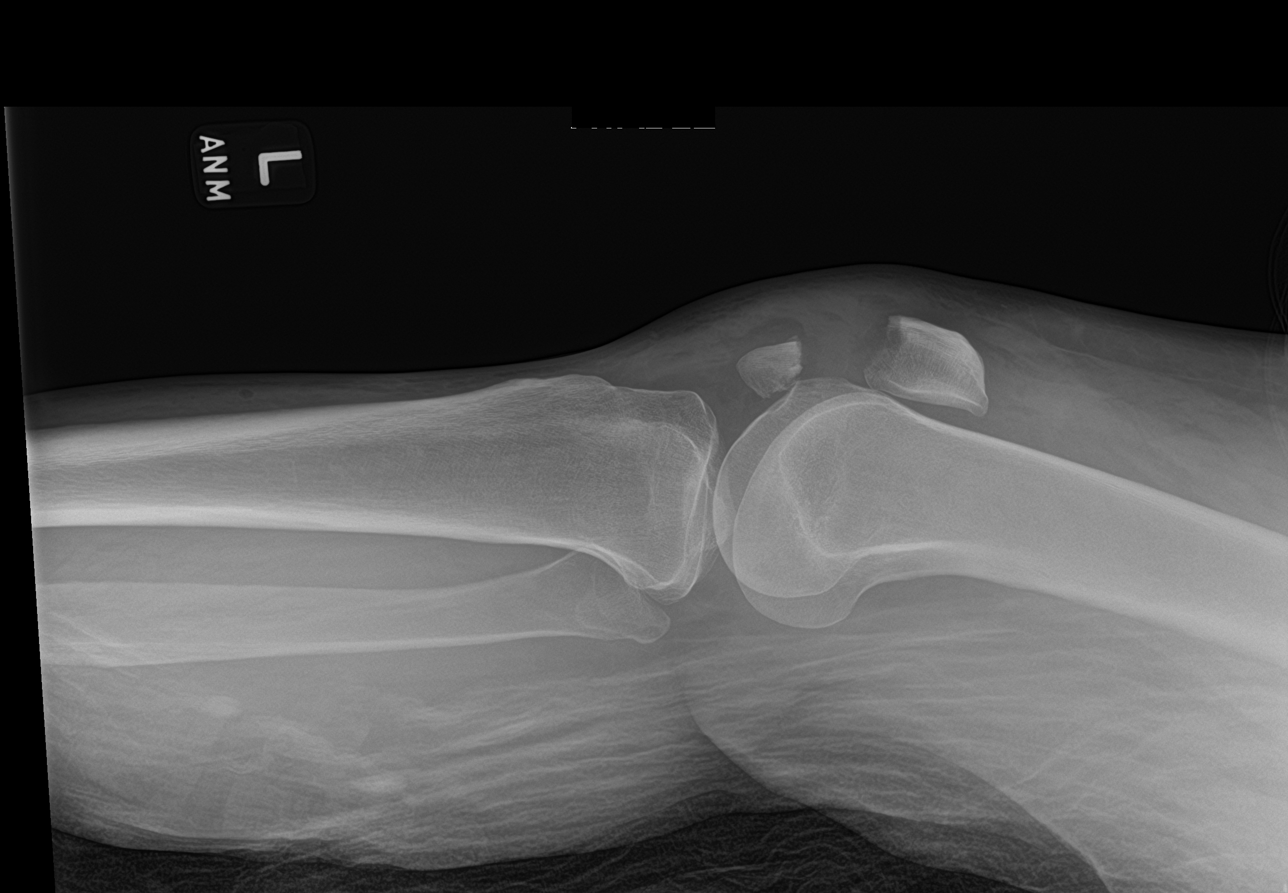

[knee obl (1 of 2)]
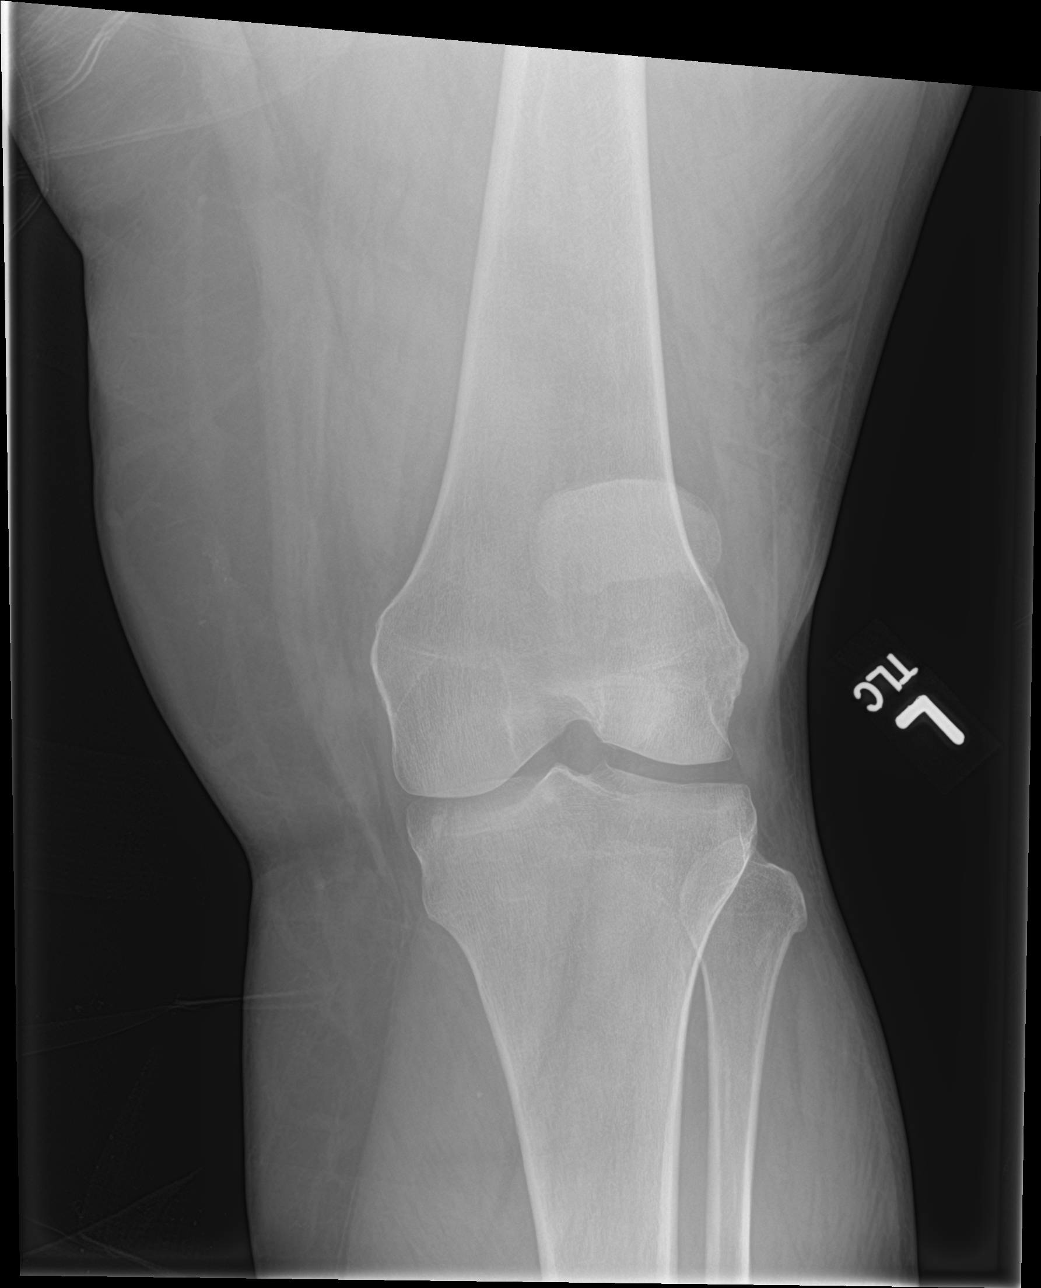

[knee obl (2 of 2)]
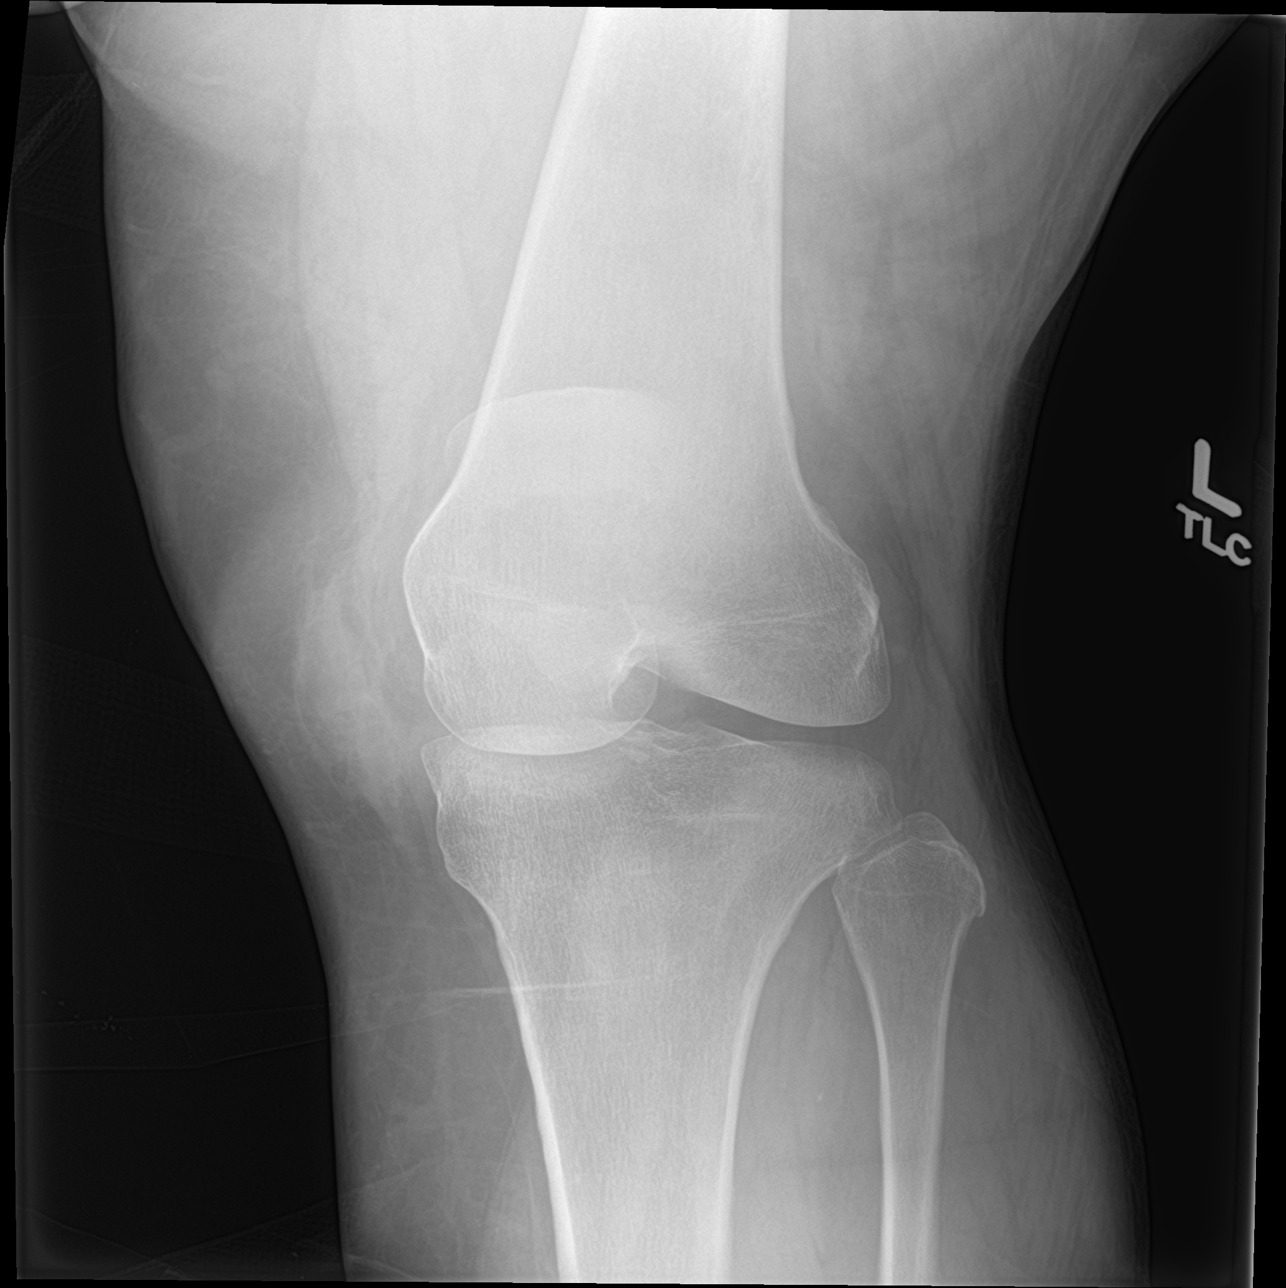

[4 of 4 positions shown; findings below may reference images not displayed]

FINDINGS: Acute transverse fracture through the inferior patellar pole with
2.6 cm distraction. No additional fracture. Multiple well corticated
ossific fragments adjacent to the medial malleolus, consistent with
old injury. No dislocation. Small the joint effusion. Joint spaces
are preserved. Bone mineralization is normal. Prepatellar soft
tissue swelling.
IMPRESSION: 1. Acute distracted transverse patellar fracture.
2. Prepatellar soft tissue swelling.

## 2023-10-21 IMAGING — CR DG TIBIA/FIBULA 2V*L*
4 series · 4 of 4 positions shown · non-contrast
Comparison: None.

CLINICAL DATA: Left knee deformity after fall.

EXAM:
LEFT KNEE - COMPLETE 4+ VIEW; LEFT TIBIA AND FIBULA - 2 VIEW

[tibia ap]
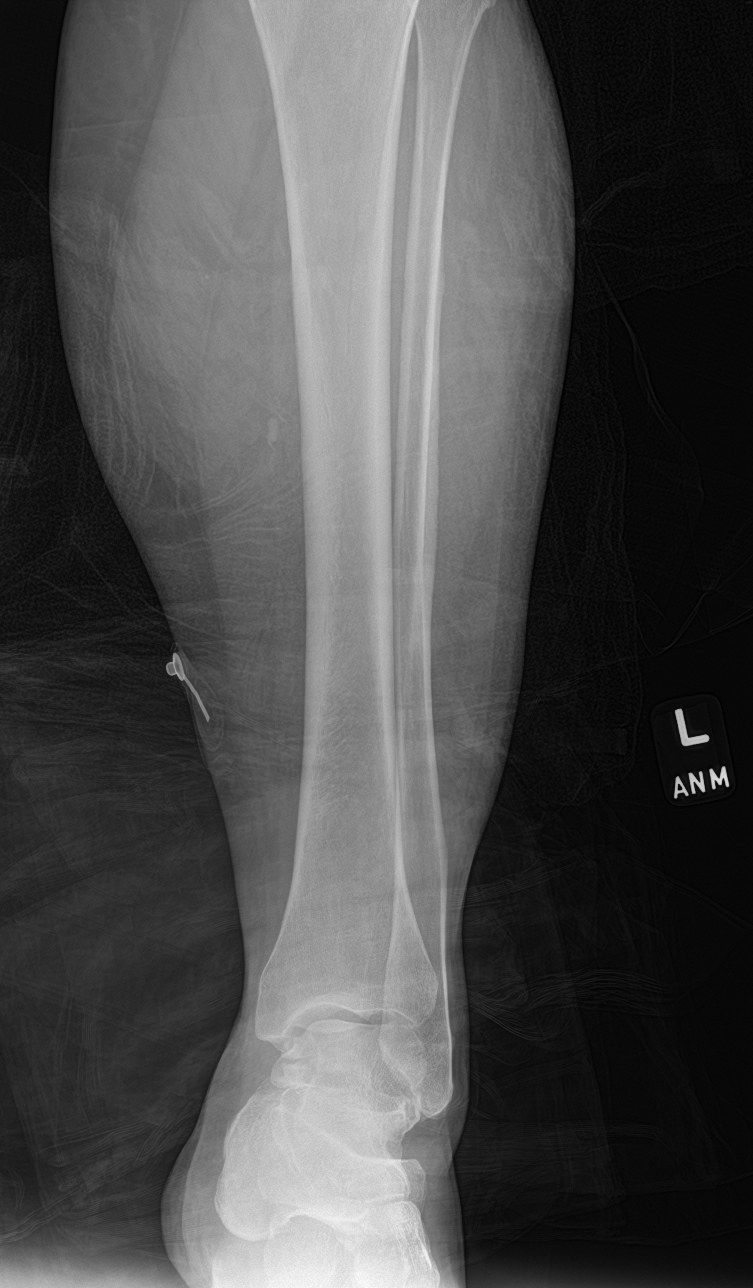

[tibia lat (1 of 2)]
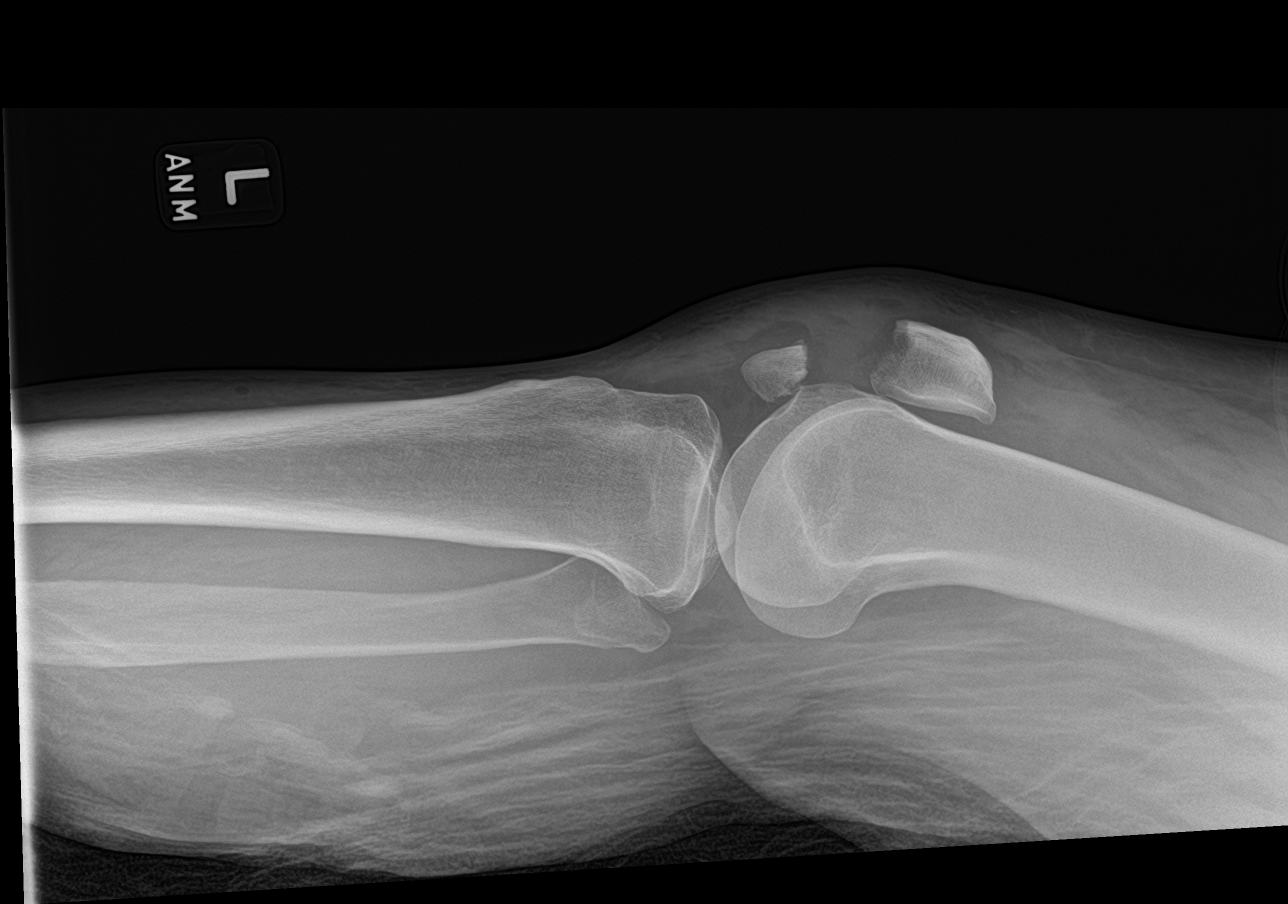

[tibia lat (2 of 2)]
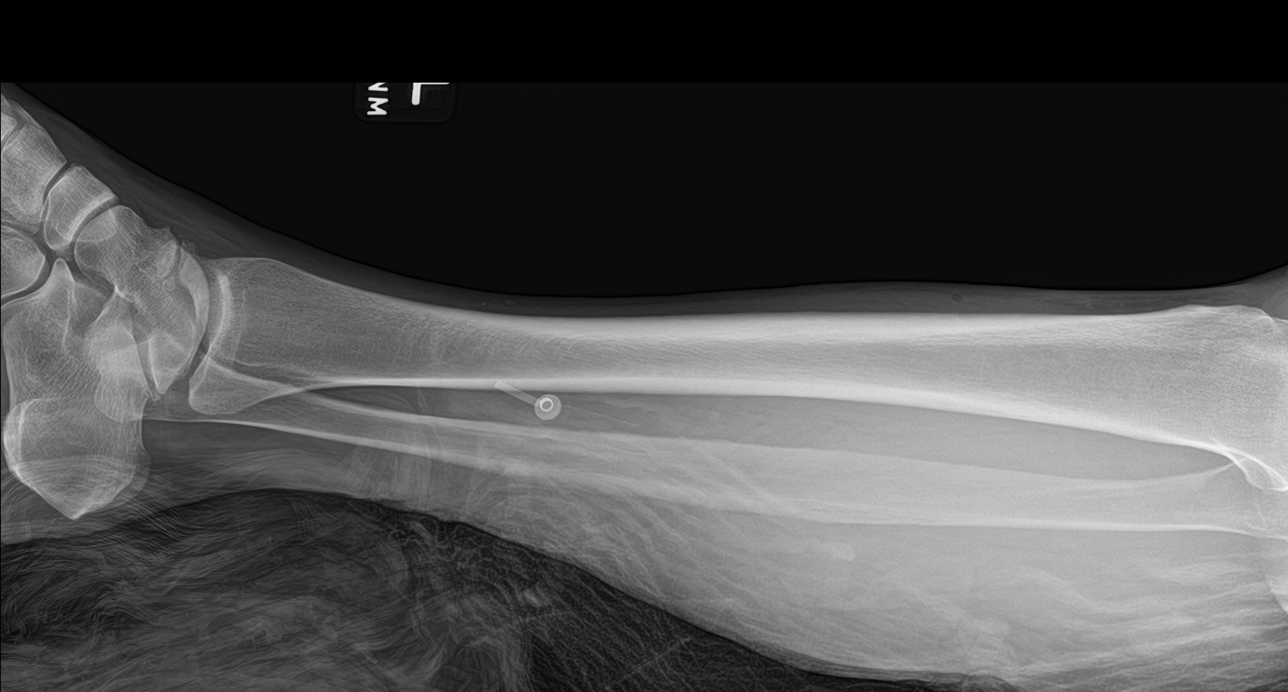

[knee ap]
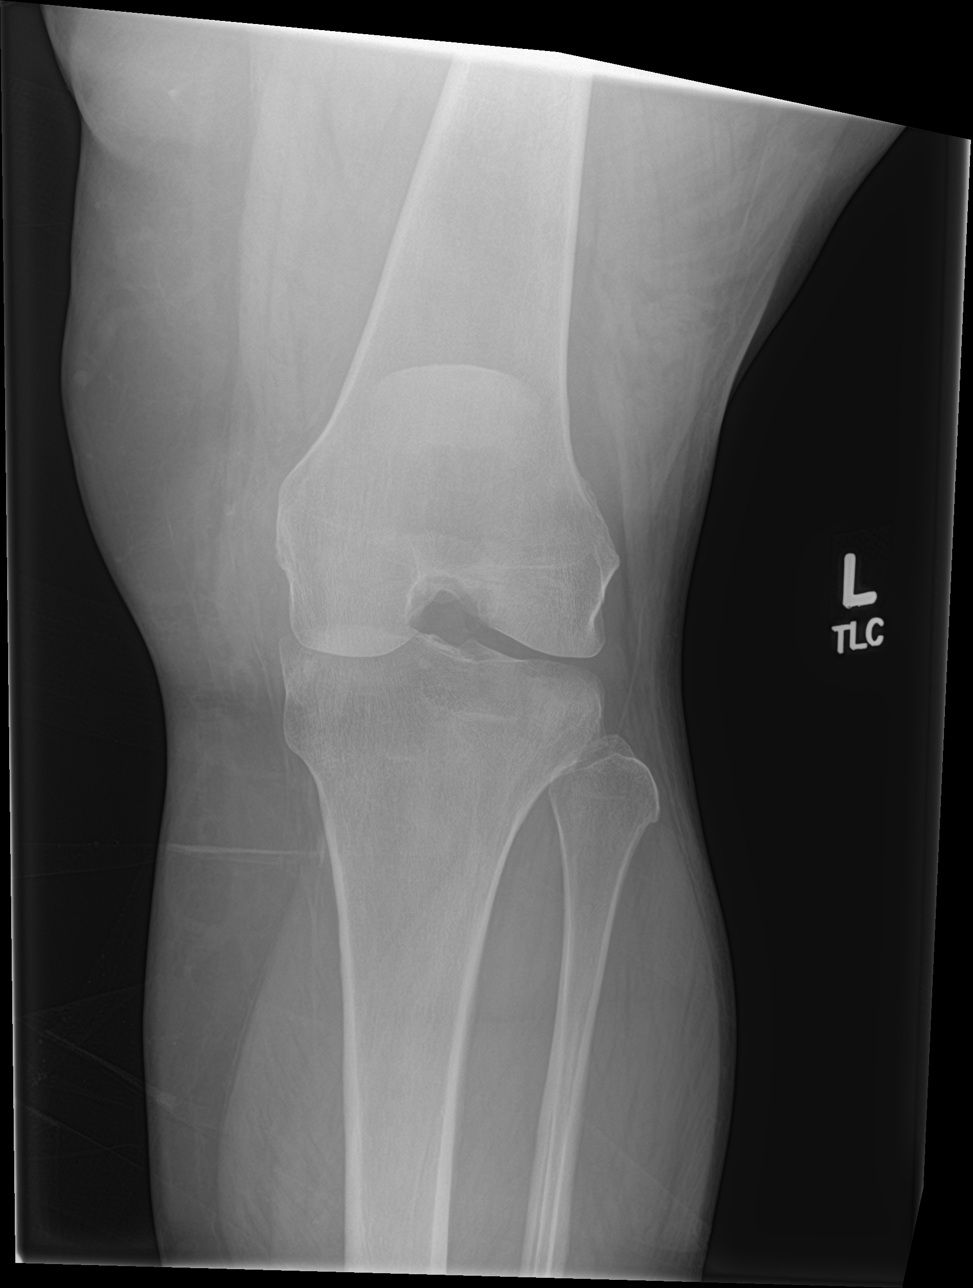

[4 of 4 positions shown; findings below may reference images not displayed]

FINDINGS: Acute transverse fracture through the inferior patellar pole with
2.6 cm distraction. No additional fracture. Multiple well corticated
ossific fragments adjacent to the medial malleolus, consistent with
old injury. No dislocation. Small the joint effusion. Joint spaces
are preserved. Bone mineralization is normal. Prepatellar soft
tissue swelling.
IMPRESSION: 1. Acute distracted transverse patellar fracture.
2. Prepatellar soft tissue swelling.

## 2024-06-19 ENCOUNTER — Other Ambulatory Visit: Payer: Self-pay | Admitting: Physician Assistant

## 2024-06-19 DIAGNOSIS — Z1231 Encounter for screening mammogram for malignant neoplasm of breast: Secondary | ICD-10-CM

## 2024-07-11 ENCOUNTER — Ambulatory Visit: Admitting: Obstetrics & Gynecology

## 2024-07-11 ENCOUNTER — Encounter: Payer: Self-pay | Admitting: Obstetrics & Gynecology

## 2024-07-11 VITALS — BP 120/75 | HR 87 | Ht 62.0 in | Wt 270.0 lb

## 2024-07-11 DIAGNOSIS — N8500 Endometrial hyperplasia, unspecified: Secondary | ICD-10-CM

## 2024-07-11 DIAGNOSIS — Z01818 Encounter for other preprocedural examination: Secondary | ICD-10-CM | POA: Diagnosis not present

## 2024-07-11 DIAGNOSIS — Z975 Presence of (intrauterine) contraceptive device: Secondary | ICD-10-CM | POA: Diagnosis not present

## 2024-07-11 NOTE — Progress Notes (Signed)
 GYN VISIT Patient name: Sabrina Jordan MRN [id number]  Date of birth: November 14, 1980 Chief Complaint:   Pre-op Exam  History of Present Illness:   Sabrina Jordan is a 43 y.o. 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  Per patient she was told that due to her weight hysterectomy was not an option.  She has been managed with Mirena that has been replaced every 5 years.  The last device was placed in 2020.  Due to the location of her cervix, she has always required surgical management of her Mirena and endometrial sampling.  He notes that until they put her upside down they are never able to visualize her cervix  Presents today to discuss surgical options as the Mirena is due for removal   Patient notes that she only has an occasional period.  Maybe had a period in July/August- typically only once a year  Prior C-section x 1  Denies pelvic or abdominal pain.  Denies vaginal discharge itching or irritation.  She reports no other acute GYN concerns.  She states that she has been thinking about this for a long time and would prefer to have permanent surgical intervention via hysterectomy.  Last pap Jan 2025  Old records reviewed- US  08/2023: 10.8 x 6 x 6 cm uterus, 204 cc.  Normal ovaries bilaterally.  IUD in proper location  No LMP recorded (lmp unknown). (Menstrual status: IUD).    Review of Systems:   Pertinent items are noted in HPI Denies fever/chills, dizziness, headaches, visual disturbances, fatigue, shortness of breath, chest pain, abdominal pain, vomiting. Pertinent History Reviewed:   Past Surgical History:  Procedure Laterality Date   BREAST SURGERY     lump removed from Left breast   CESAREAN SECTION     DENTAL SURGERY     DILATATION & CURETTAGE/HYSTEROSCOPY WITH MYOSURE N/A 07/10/2019   Procedure: DILATATION & CURETTAGE/HYSTEROSCOPY WITH MYOSURE;   Surgeon: Timmie Norris, MD;  Location: [facility];  Service: Gynecology;  Laterality: N/A;  Jordan [employer] rep will cover case.  confirmed on 07/09/19 CS   HYSTEROSCOPY WITH D & C  11/16/2011   Procedure: DILATATION AND CURETTAGE /HYSTEROSCOPY;  Surgeon: Norris Timmie, MD;  Location: [facility];  Service: Gynecology;  Laterality: N/A;  pap smear performed   INTRAUTERINE DEVICE (IUD) INSERTION N/A 07/10/2019   Procedure: INTRAUTERINE DEVICE (IUD) INSERTION;  Surgeon: Timmie Norris, MD;  Location:  SURGERY CENTER;  Service: Gynecology;  Laterality: N/A;   ORIF PATELLA Left 08/11/2021   Procedure: OPEN REDUCTION INTERNAL (ORIF) FIXATION PATELLA;  Surgeon: Sharl Selinda Dover, MD;  Location: [facility] OR;  Service: Orthopedics;  Laterality: Left;  90 requested 5:00 Start    Past Medical History:  Diagnosis Date   Abnormal uterine bleeding    Anxiety    panic disorder   Depression    Dysrhythmia    sinus ventricular tachycardia per pt   Elevated liver enzymes    no diagnosis- neg work up- intermittent elevations   GERD (gastroesophageal reflux disease)    no meds currently   Hypertension    PONV (postoperative nausea and vomiting)    Reviewed problem list, medications and allergies. Physical Assessment:   Vitals:   07/11/24 1144 07/11/24 1149  BP: (!) 141/84 120/75  Pulse: 87   Weight: 270 lb (122.5 kg)   Height: 5' 2 (1.575 m)   Body mass index is  49.38 kg/m.       Physical Examination:   General appearance: alert, well appearing, and in no distress  Psych: mood appropriate, normal affect  Skin: warm & dry   Cardiovascular: normal heart rate noted  Respiratory: normal respiratory effort, no distress  Abdomen: obese, soft, non-tender  Pelvic: VULVA: normal appearing vulva with no masses, tenderness or lesions, VAGINA: normal appearing vagina with normal color and discharge, no lesions.  With placement of speculum, unable to visualize cervix as  anticipated.  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  -Explained that this surgery is performed to remove the uterus through several small incisions in the abdomen- pending anatomy 3-5 ports. I discussed the risks and benefits of the surgery, including, but not limited to risk of bleeding, including the need for blood transfusion, infection, damage to surrounding organs and tissues such as damage to bladder, ureter or bowel that would requiring additional procedures.  Reviewed long term complications such as fistula or dehiscence requiring further surgical intervention.  Discussed possible need for conversion to an open procedure and potential for other complications that cannot be predicted or prevented including DVT, PE or death. -- Discussed ovarian preservation and potential risk for further surgical intervention should endometrial carcinoma be noted.  Reviewed pros cons of ovarian preservation and patient does desire to preserve ovaries if possible - Reviewed her concerns regarding her body habitus, discussed that robotic procedure is indeed performed with Trendelenburg and I cannot guarantee that from an anesthesia perspective that we may be unable to complete surgery in this fashion in which case conversion to an open procedure would be required - Reviewed her concerns about hip and back pain and discussed that we do our best to keep patients in anatomical position to help prevent these concerns -typical recovery 8-12 wks with several weeks off work and 8wks of pelvic rest -questions/concerns were addressed and pt desires to proceed - plan to  schedule Jan 14   No orders of the defined types were placed in this encounter.   Return for TBD.   Mari Battaglia, DO Attending Obstetrician & Gynecologist, [facility] for [employer], [facility]

## 2024-07-26 ENCOUNTER — Encounter: Payer: Self-pay | Admitting: Obstetrics & Gynecology

## 2024-08-06 LAB — COLOGUARD: COLOGUARD: NEGATIVE

## 2024-08-30 NOTE — Patient Instructions (Signed)
 "       Sabrina Jordan  08/30/2024     @PREFPERIOPPHARMACY @   Your procedure is scheduled on 09/04/2024.   Report to [facility] at 0600 A.M.   Call this number if you have problems the morning of surgery:  [phone]  If you experience any cold or flu symptoms such as cough, fever, chills, shortness of breath, etc. between now and your scheduled surgery, please notify us  at the above number.   Remember:  Do not eat after midnight.   You may drink clear liquids until  0330 am on 09/04/2024.      Clear liquids allowed are:                    Water , Carbonated beverages (diabetics please choose diet or no sugar options), Black Coffee Only (No creamer, milk or cream, including half & half and powdered creamer), and Clear Sports drink (No red color; diabetics please choose diet or no sugar options)               Take these medicines the morning of surgery with A SIP OF WATER                 lorazepam, meloxican(if needed), robaxin, toprol XL.    Do not wear jewelry, make-up or nail polish, including gel polish,  artificial nails, or any other type of covering on natural nails (fingers and  toes).  Do not wear lotions, powders, or perfumes, or deodorant.  Do not shave 48 hours prior to surgery.  Men may shave face and neck.  Do not bring valuables to the hospital.  [facility] is not responsible for any belongings or valuables.  Contacts, dentures or bridgework may not be worn into surgery.  Leave your suitcase in the car.  After surgery it may be brought to your room.  For patients admitted to the hospital, discharge time will be determined by your treatment team.  Patients discharged the day of surgery will not be allowed to drive home and must have someone with them for 24 hours.    Special instructions:   DO NOT smoke tobacco or vape for 24 hours before your procedure.  Please read over the following fact sheets that you were given. Pain Booklet, Coughing and Deep  Breathing, Surgical Site Infection Prevention, Anesthesia Post-op Instructions, and Care and Recovery After Surgery        Laparoscopically Assisted Vaginal Hysterectomy, Care After After a LAVH, it's common to have soreness and numbness in your incision areas. You'll have pain in your belly. You may also have: Bleeding and discharge from the vagina. Tiredness. Sadness and other emotions. If your ovaries were taken out, you may also have symptoms of menopause, such as hot flashes, night sweats, and lack of sleep. Follow these instructions at home: Medicines Take over-the-counter and prescription medicines only as told by your health care provider. If you were given antibiotics, take them as told by your provider. Do not stop taking the antibiotic even if you start to feel better. Ask your provider if the medicine prescribed to you: Requires you to avoid driving or using machinery. Can cause constipation. You may need to take these actions to prevent or treat constipation: Drink enough fluid to keep your pee (urine) pale yellow. Take over-the-counter or prescription medicines. Eat foods that are high in fiber, such as beans, whole grains, and fresh fruits and vegetables. Limit foods that are high in fat and processed sugars, such  as fried or sweet foods. Incision care  Follow instructions from your provider about how to take care of your incisions. Make sure you: Wash your hands with soap and water  for at least 20 seconds before and after you change your bandage. If soap and water  aren't available, use hand sanitizer. Change your dressing as told by your provider. Leave stitches, skin glue, or tape strips in place. These skin closures may need to stay in place for 2 weeks or longer. If tape strip edges start to loosen and curl up, you may trim the loose edges. Do not remove tape strips completely unless your provider tells you to do that. Check your incision areas every day for signs of  infection. Check for: More redness, swelling, or pain. More fluid or blood. Warmth. Pus or a bad smell. Activity  Rest as told by your provider. Do not sit for a long time without moving. Get up to take short walks every 1-2 hours. This will improve blood flow and breathing. Ask for help if you feel weak or unsteady. You may have to avoid lifting. Ask your provider how much you can safely lift. Return to your normal activities as told by your provider. Ask your provider what activities are safe for you. Lifestyle Do not use any products that contain nicotine or tobacco. These products include cigarettes, chewing tobacco, and vaping devices, such as e-cigarettes. These can delay healing after surgery. If you need help quitting, ask your provider. Do not drink alcohol until your provider approves. General instructions Do not douche, use tampons, have sex, or put anything in the vagina for at least 6 weeks. If you struggle with physical or emotional changes after your procedure, speak with your provider or a therapist. Do not take baths, swim, or use a hot tub until your provider approves. Ask your provider if you may take showers. You may only be allowed to take sponge baths. Try to have someone at home with you for the first 1-2 weeks to help with your daily chores. Wear compression stockings as told by your provider. These stockings help to prevent blood clots and reduce swelling in your legs. Your provider may give you more instructions. Make sure you know what you can and can't do. Contact a health care provider if: You have any signs of infection. You have pain and your pain medicine doesn't help. You feel dizzy or light-headed. You have trouble peeing. You vomit or feel like you may vomit, and the symptoms do not go away. You have pus or discharge from your vagina that smells bad. Get help right away if: You have a fever and your symptoms suddenly get worse. You have very bad pain  in the abdomen. You have chest pain or shortness of breath. You faint. You have pain, swelling, or redness in your leg. You have heavy bleeding in your vagina that soaks through a pad in less than 1 hour. You see blood clots in your bleeding. These symptoms may be an emergency. Get help right away. Call 911. Do not wait to see if the symptoms will go away. Do not drive yourself to the hospital. This information is not intended to replace advice given to you by your health care provider. Make sure you discuss any questions you have with your health care provider. Document Revised: 11/18/2022 Document Reviewed: 11/18/2022 Elsevier Patient Education  2024 Elsevier Inc.General Anesthesia, Adult, Care After The following information offers guidance on how to care for yourself after your  procedure. Your health care provider may also give you more specific instructions. If you have problems or questions, contact your health care provider. What can I expect after the procedure? After the procedure, it is common for people to: Have pain or discomfort at the IV site. Have nausea or vomiting. Have a sore throat or hoarseness. Have trouble concentrating. Feel cold or chills. Feel weak, sleepy, or tired (fatigue). Have soreness and body aches. These can affect parts of the body that were not involved in surgery. Follow these instructions at home: For the time period you were told by your health care provider:  Rest. Do not participate in activities where you could fall or become injured. Do not drive or use machinery. Do not drink alcohol. Do not take sleeping pills or medicines that cause drowsiness. Do not make important decisions or sign legal documents. Do not take care of children on your own. General instructions Drink enough fluid to keep your urine pale yellow. If you have sleep apnea, surgery and certain medicines can increase your risk for breathing problems. Follow instructions from  your health care provider about wearing your sleep device: Anytime you are sleeping, including during daytime naps. While taking prescription pain medicines, sleeping medicines, or medicines that make you drowsy. Return to your normal activities as told by your health care provider. Ask your health care provider what activities are safe for you. Take over-the-counter and prescription medicines only as told by your health care provider. Do not use any products that contain nicotine or tobacco. These products include cigarettes, chewing tobacco, and vaping devices, such as e-cigarettes. These can delay incision healing after surgery. If you need help quitting, ask your health care provider. Contact a health care provider if: You have nausea or vomiting that does not get better with medicine. You vomit every time you eat or drink. You have pain that does not get better with medicine. You cannot urinate or have bloody urine. You develop a skin rash. You have a fever. Get help right away if: You have trouble breathing. You have chest pain. You vomit blood. These symptoms may be an emergency. Get help right away. Call 911. Do not wait to see if the symptoms will go away. Do not drive yourself to the hospital. Summary After the procedure, it is common to have a sore throat, hoarseness, nausea, vomiting, or to feel weak, sleepy, or fatigue. For the time period you were told by your health care provider, do not drive or use machinery. Get help right away if you have difficulty breathing, have chest pain, or vomit blood. These symptoms may be an emergency. This information is not intended to replace advice given to you by your health care provider. Make sure you discuss any questions you have with your health care provider. Document Revised: 11/05/2021 Document Reviewed: 11/05/2021 Elsevier Patient Education  2024 Elsevier Inc.How to Use Chlorhexidine  at Home in the Shower Chlorhexidine  gluconate  (CHG) is a germ-killing (antiseptic) wash that's used to clean the skin. It can get rid of the germs that normally live on the skin and can keep them away for about 24 hours. If you're having surgery, you may be told to shower with CHG at home the night before surgery. This can help lower your risk for infection. To use CHG wash in the shower, follow the steps below. Supplies needed: CHG body wash. Clean washcloth. Clean towel. How to use CHG in the shower Follow these steps unless you're told to use  CHG in a different way: Start the shower. Use your normal soap and shampoo to wash your face and hair. Turn off the shower or move out of the shower stream. Pour CHG onto a clean washcloth. Do not use any type of brush or rough sponge. Start at your neck, washing your body down to your toes. Make sure you: Wash the part of your body where the surgery will be done for at least 1 minute. Do not scrub. Do not use CHG on your head or face unless your health care provider tells you to. If it gets into your ears or eyes, rinse them well with water . Do not wash your genitals with CHG. Wash your back and under your arms. Make sure to wash skin folds. Let the CHG sit on your skin for 1-2 minutes or as long as told. Rinse your entire body in the shower, including all body creases and folds. Turn off the shower. Dry off with a clean towel. Do not put anything on your skin afterward, such as powder, lotion, or perfume. Put on clean clothes or pajamas. If it's the night before surgery, sleep in clean sheets. General tips Use CHG only as told, and follow the instructions on the label. Use the full amount of CHG as told. This is often one bottle. Do not smoke and stay away from flames after using CHG. Your skin may feel sticky after using CHG. This is normal. The sticky feeling will go away as the CHG dries. Do not use CHG: If you have a chlorhexidine  allergy or have reacted to chlorhexidine  in the  past. On open wounds or areas of skin that have broken skin, cuts, or scrapes. On babies younger than 84 months of age. Contact a health care provider if: You have questions about using CHG. Your skin gets irritated or itchy. You have a rash after using CHG. You swallow any CHG. Call your local poison control center [phone] in the [place] Your eyes itch badly, or they become very red or swollen. Your hearing changes. You have trouble seeing. If you can't reach your provider, go to an urgent care or emergency room. Do not drive yourself. Get help right away if: You have swelling or tingling in your mouth or throat. You make high-pitched whistling sounds when you breathe, most often when you breathe out (wheeze). You have trouble breathing. These symptoms may be an emergency. Call 911 right away. Do not wait to see if the symptoms will go away. Do not drive yourself to the hospital. This information is not intended to replace advice given to you by your health care provider. Make sure you discuss any questions you have with your health care provider. Document Revised: 02/21/2023 Document Reviewed: 02/17/2022 Elsevier Patient Education  2024 [employer]. "

## 2024-09-02 ENCOUNTER — Other Ambulatory Visit: Payer: Self-pay

## 2024-09-02 ENCOUNTER — Encounter (HOSPITAL_COMMUNITY): Payer: Self-pay

## 2024-09-02 ENCOUNTER — Encounter (HOSPITAL_COMMUNITY)
Admission: RE | Admit: 2024-09-02 | Discharge: 2024-09-02 | Disposition: A | Source: Ambulatory Visit | Attending: Obstetrics & Gynecology

## 2024-09-02 VITALS — BP 160/88 | HR 88 | Resp 18 | Ht 62.0 in | Wt 270.1 lb

## 2024-09-02 DIAGNOSIS — I471 Supraventricular tachycardia, unspecified: Secondary | ICD-10-CM | POA: Diagnosis not present

## 2024-09-02 DIAGNOSIS — Z01818 Encounter for other preprocedural examination: Secondary | ICD-10-CM | POA: Diagnosis present

## 2024-09-02 HISTORY — DX: Supraventricular tachycardia, unspecified: I47.10

## 2024-09-02 HISTORY — DX: Fibromyalgia: M79.7

## 2024-09-02 LAB — BASIC METABOLIC PANEL WITH GFR
Anion gap: 15 (ref 5–15)
BUN: 10 mg/dL (ref 6–20)
CO2: 21 mmol/L — ABNORMAL LOW (ref 22–32)
Calcium: 9 mg/dL (ref 8.9–10.3)
Chloride: 101 mmol/L (ref 98–111)
Creatinine, Ser: 0.66 mg/dL (ref 0.44–1.00)
GFR, Estimated: >60 mL/min
Glucose, Bld: 148 mg/dL — ABNORMAL HIGH (ref 70–99)
Potassium: 3.4 mmol/L — ABNORMAL LOW (ref 3.5–5.1)
Sodium: 137 mmol/L (ref 135–145)

## 2024-09-02 LAB — PREGNANCY, URINE: Preg Test, Ur: NEGATIVE

## 2024-09-02 LAB — CBC
HCT: 46.2 % — ABNORMAL HIGH (ref 36.0–46.0)
Hemoglobin: 15.7 g/dL — ABNORMAL HIGH (ref 12.0–15.0)
MCH: 31.5 pg (ref 26.0–34.0)
MCHC: 34 g/dL (ref 30.0–36.0)
MCV: 92.8 fL (ref 80.0–100.0)
Platelets: 259 K/uL (ref 150–400)
RBC: 4.98 MIL/uL (ref 3.87–5.11)
RDW: 12.9 % (ref 11.5–15.5)
WBC: 9 K/uL (ref 4.0–10.5)
nRBC: 0 % (ref 0.0–0.2)

## 2024-09-02 LAB — TYPE AND SCREEN
ABO/RH(D): A POS
Antibody Screen: NEGATIVE

## 2024-09-03 NOTE — H&P (Signed)
 [facility]ne[facility] Attending History and Physical  Sabrina Jordan is a 44 y.o. G2P1011who presents for scheduled robotic assisted laparoscopic hysterectomy, bilateral salpingectomy due to endometrial hyperplasia.  In review, she has been managed with Mirena that has been replaced every 5 years.  The last device was placed in 2020.  Due to the location of her cervix, she has always required surgical management of her Mirena and endometrial sampling.  She notes that until they put her upside down they are never able to visualize her cervix.  At this time, she desires to proceed with permanent surgical intervention.     Patient notes that she only has an occasional period.  Maybe had a period in July/August- typically only once a year  Denies any abnormal vaginal discharge, fevers, chills, sweats, dysuria, nausea, vomiting, other GI or GU symptoms or other general symptoms. ***  Past Medical History:  Diagnosis Date   Abnormal uterine bleeding    Anxiety    panic disorder   Depression    Dysrhythmia    sinus ventricular tachycardia per pt   Fibromyalgia    GERD (gastroesophageal reflux disease)    no meds currently   Hypertension    PONV (postoperative nausea and vomiting)    SVT (supraventricular tachycardia)    Past Surgical History:  Procedure Laterality Date   BREAST SURGERY     lump removed from Left breast   CESAREAN SECTION     DENTAL SURGERY     DILATATION & CURETTAGE/HYSTEROSCOPY WITH MYOSURE N/A 07/10/2019   Procedure: DILATATION & CURETTAGE/HYSTEROSCOPY WITH MYOSURE;  Surgeon: Timmie Norris, MD;  Location: [facility];  Service: Gynecology;  Laterality: N/A;  Jordan [employer] rep will cover case.  confirmed on 07/09/19 CS   HYSTEROSCOPY WITH D & C  11/16/2011   Procedure: DILATATION AND CURETTAGE /HYSTEROSCOPY;  Surgeon: Norris Timmie, MD;  Location: [facility];  Service: Gynecology;  Laterality: N/A;  pap smear performed    INTRAUTERINE DEVICE (IUD) INSERTION N/A 07/10/2019   Procedure: INTRAUTERINE DEVICE (IUD) INSERTION;  Surgeon: Timmie Norris, MD;  Location:  SURGERY CENTER;  Service: Gynecology;  Laterality: N/A;   ORIF PATELLA Left 08/11/2021   Procedure: OPEN REDUCTION INTERNAL (ORIF) FIXATION PATELLA;  Surgeon: Sharl Selinda Dover, MD;  Location: [facility] OR;  Service: Orthopedics;  Laterality: Left;  90 requested 5:00 Start   OB History  Gravida Para Term Preterm AB Living  2    1 1   SAB IAB Ectopic Multiple Live Births  1    1    # Outcome Date GA Lbr Len/2nd Weight Sex Type Anes PTL Lv  2 Gravida      CS-Unspec   LIV  1 SAB           Patient denies any other pertinent gynecologic issues.  Medications Ordered Prior to Encounter[1] Allergies[2]  Social History:   reports that she has never smoked. She has never used smokeless tobacco. She reports current alcohol use. She reports that she does not use drugs. Family History  Problem Relation Age of Onset   Diabetes Mother    Breast cancer Mother    Hypertension Mother     Review of Systems: Pertinent items noted in HPI and remainder of comprehensive ROS otherwise negative.  PHYSICAL EXAM: There were no vitals taken for this visit. CONSTITUTIONAL: Well-developed, well-nourished female in no acute distress.  SKIN: Skin is warm and dry. No rash noted. Not diaphoretic. No erythema. No pallor. NEUROLOGIC: Alert and  oriented to person, place, and time. Normal reflexes, muscle tone coordination. No cranial nerve deficit noted. PSYCHIATRIC: Normal mood and affect. Normal behavior. Normal judgment and thought content. CARDIOVASCULAR: Normal heart rate noted, regular rhythm RESPIRATORY: Effort and breath sounds normal, no problems with respiration noted ABDOMEN: Soft, nontender, nondistended. PELVIC: deferred MUSCULOSKELETAL: no calf tenderness bilaterally EXT: no edema bilaterally, normal pulses  Labs: Results for orders placed or  performed during the hospital encounter of 09/02/24 (from the past 2 weeks)  CBC   Collection Time: 09/02/24  3:43 PM  Result Value Ref Range   WBC 9.0 4.0 - 10.5 K/uL   RBC 4.98 3.87 - 5.11 MIL/uL   Hemoglobin 15.7 (H) 12.0 - 15.0 g/dL   HCT 53.7 (H) 63.9 - 53.9 %   MCV 92.8 80.0 - 100.0 fL   MCH 31.5 26.0 - 34.0 pg   MCHC 34.0 30.0 - 36.0 g/dL   RDW 87.0 88.4 - 84.4 %   Platelets 259 150 - 400 K/uL   nRBC 0.0 0.0 - 0.2 %  Basic metabolic panel   Collection Time: 09/02/24  3:43 PM  Result Value Ref Range   Sodium 137 135 - 145 mmol/L   Potassium 3.4 (L) 3.5 - 5.1 mmol/L   Chloride 101 98 - 111 mmol/L   CO2 21 (L) 22 - 32 mmol/L   Glucose, Bld 148 (H) 70 - 99 mg/dL   BUN 10 6 - 20 mg/dL   Creatinine, Ser 9.33 0.44 - 1.00 mg/dL   Calcium 9.0 8.9 - 89.6 mg/dL   GFR, Estimated >39 >39 mL/min   Anion gap 15 5 - 15  Type and screen   Collection Time: 09/02/24  3:43 PM  Result Value Ref Range   ABO/RH(D) A POS    Antibody Screen NEG    Sample Expiration 09/16/2024,2359    Extend sample reason      NO TRANSFUSIONS OR PREGNANCY IN THE PAST 3 MONTHS Performed at [facility], [address] [place], [place] [address]   Pregnancy, urine   Collection Time: 09/02/24  3:44 PM  Result Value Ref Range   Preg Test, Ur NEGATIVE NEGATIVE    Imaging Studies: US  08/2023: 10.8 x 6 x 6 cm uterus, 204 cc.  Normal ovaries bilaterally.  IUD in proper location   Assessment: Simple hyperplasia  Plan: Robotic assisted laparoscopic hysterectomy, bilateral salpingectomy, possible cystoscopy, possible open -Ancef  3g -IV Toradol  -NPO -LR @ 125cc/hr -SCDs to OR -Risk/benefits and alternatives reviewed with the patient including but not limited to risk of bleeding, infection and injury to surrounding organs requiring further surgical intervention.  Also discussed potential for conversion to open, anesthesia complications or other surgical complications.  Questions and concerns were addressed and  pt desires to proceed  Mae Cianci, DO Attending Obstetrician & Gynecologist, [facility] for [facility], [facility]     [1]  No current facility-administered medications on file prior to encounter.   Current Outpatient Medications on File Prior to Encounter  Medication Sig Dispense Refill   fexofenadine (ALLEGRA) 180 MG tablet Take 180 mg by mouth daily as needed for allergies or rhinitis.     fluticasone (FLONASE) 50 MCG/ACT nasal spray Place 1 spray into both nostrils daily as needed for allergies or rhinitis.     guaiFENesin (MUCINEX) 600 MG 12 hr tablet Take 600 mg by mouth 2 (two) times daily as needed for cough or to loosen phlegm.     ibuprofen  (ADVIL ) 200 MG  tablet Take 200-400 mg by mouth every 6 (six) hours as needed (for mild pain or headaches).     lisinopril-hydrochlorothiazide (PRINZIDE,ZESTORETIC) 20-12.5 MG per tablet Take 1 tablet by mouth at bedtime.     LORazepam (ATIVAN) 1 MG tablet Take 1 mg by mouth daily as needed for anxiety.     meloxicam (MOBIC) 15 MG tablet Take 15 mg by mouth daily as needed for pain.     methocarbamol (ROBAXIN) 500 MG tablet Take 500 mg by mouth every 8 (eight) hours as needed for muscle spasms.     rosuvastatin (CRESTOR) 10 MG tablet Take 10 mg by mouth daily.     TOPROL XL 100 MG 24 hr tablet Take 100 mg by mouth at bedtime. Take with or immediately following a meal.    [2]  Allergies Allergen Reactions   Sulfa Antibiotics Hives and Rash   Aspirin Other (See Comments)    GI Intolerance

## 2024-09-04 ENCOUNTER — Encounter (HOSPITAL_COMMUNITY): Payer: Self-pay | Admitting: Obstetrics & Gynecology

## 2024-09-04 ENCOUNTER — Ambulatory Visit (HOSPITAL_COMMUNITY)
Admission: RE | Admit: 2024-09-04 | Discharge: 2024-09-04 | Disposition: A | Attending: Obstetrics & Gynecology | Admitting: Obstetrics & Gynecology

## 2024-09-04 ENCOUNTER — Other Ambulatory Visit: Payer: Self-pay

## 2024-09-04 ENCOUNTER — Ambulatory Visit (HOSPITAL_COMMUNITY): Admitting: Anesthesiology

## 2024-09-04 ENCOUNTER — Encounter (HOSPITAL_COMMUNITY)
Admission: RE | Disposition: A | Discharge status: Home / Self Care | Payer: Self-pay | Source: Home / Self Care | Attending: Obstetrics & Gynecology

## 2024-09-04 ENCOUNTER — Encounter (HOSPITAL_COMMUNITY): Admitting: Anesthesiology

## 2024-09-04 DIAGNOSIS — N888 Other specified noninflammatory disorders of cervix uteri: Secondary | ICD-10-CM | POA: Diagnosis not present

## 2024-09-04 DIAGNOSIS — I10 Essential (primary) hypertension: Secondary | ICD-10-CM | POA: Diagnosis not present

## 2024-09-04 DIAGNOSIS — N736 Female pelvic peritoneal adhesions (postinfective): Secondary | ICD-10-CM | POA: Diagnosis not present

## 2024-09-04 DIAGNOSIS — Z01818 Encounter for other preprocedural examination: Secondary | ICD-10-CM

## 2024-09-04 DIAGNOSIS — F419 Anxiety disorder, unspecified: Secondary | ICD-10-CM | POA: Diagnosis not present

## 2024-09-04 DIAGNOSIS — N8500 Endometrial hyperplasia, unspecified: Secondary | ICD-10-CM | POA: Diagnosis present

## 2024-09-04 DIAGNOSIS — N8501 Benign endometrial hyperplasia: Secondary | ICD-10-CM

## 2024-09-04 DIAGNOSIS — D251 Intramural leiomyoma of uterus: Secondary | ICD-10-CM | POA: Diagnosis not present

## 2024-09-04 DIAGNOSIS — I471 Supraventricular tachycardia, unspecified: Secondary | ICD-10-CM

## 2024-09-04 HISTORY — PX: ROBOTIC ASSISTED LAPAROSCOPIC LYSIS OF ADHESION: SHX6080

## 2024-09-04 HISTORY — PX: HYSTERECTOMY, TOTAL, LAPAROSCOPIC, ROBOT-ASSISTED WITH SALPINGECTOMY: SHX7587

## 2024-09-04 LAB — ABO/RH: ABO/RH(D): A POS

## 2024-09-04 MED ORDER — DOCUSATE SODIUM 100 MG PO CAPS: 100.0000 mg | ORAL_CAPSULE | Freq: Two times a day (BID) | ORAL | 0 refills | Status: AC

## 2024-09-04 MED ORDER — SUGAMMADEX SODIUM 200 MG/2ML IV SOLN
INTRAVENOUS | Status: DC | PRN
  Administered 2024-09-04: 400 mg via INTRAVENOUS

## 2024-09-04 MED ORDER — ROCURONIUM BROMIDE 10 MG/ML (PF) SYRINGE
PREFILLED_SYRINGE | INTRAVENOUS | Status: AC
  Filled 2024-09-04: qty 10

## 2024-09-04 MED ORDER — ONDANSETRON 4 MG PO TBDP: 4.0000 mg | ORAL_TABLET | Freq: Three times a day (TID) | ORAL | 0 refills | Status: AC | PRN

## 2024-09-04 MED ORDER — HEMOSTATIC AGENTS (NO CHARGE) OPTIME
TOPICAL | Status: DC | PRN
  Administered 2024-09-04: 1 via TOPICAL

## 2024-09-04 MED ORDER — LACTATED RINGERS IV SOLN: INTRAVENOUS | Status: DC

## 2024-09-04 MED ORDER — PHENYLEPHRINE 80 MCG/ML (10ML) SYRINGE FOR IV PUSH (FOR BLOOD PRESSURE SUPPORT)
PREFILLED_SYRINGE | INTRAVENOUS | Status: AC
  Filled 2024-09-04: qty 10

## 2024-09-04 MED ORDER — ORAL CARE MOUTH RINSE: 15.0000 mL | Freq: Once | OROMUCOSAL | Status: AC

## 2024-09-04 MED ORDER — HYDROMORPHONE HCL 1 MG/ML IJ SOLN
INTRAMUSCULAR | Status: AC
  Filled 2024-09-04: qty 0.5

## 2024-09-04 MED ORDER — CEFAZOLIN SODIUM-DEXTROSE 3-4 GM/150ML-% IV SOLN
3.0000 g | INTRAVENOUS | Status: AC
  Administered 2024-09-04: 3 g via INTRAVENOUS
  Filled 2024-09-04: qty 150

## 2024-09-04 MED ORDER — STERILE WATER FOR IRRIGATION IR SOLN
Status: DC | PRN
  Administered 2024-09-04: 1000 mL

## 2024-09-04 MED ORDER — OXYCODONE HCL 5 MG PO TABS
5.0000 mg | ORAL_TABLET | Freq: Once | ORAL | Status: AC | PRN
  Administered 2024-09-04: 5 mg via ORAL
  Filled 2024-09-04: qty 1

## 2024-09-04 MED ORDER — LIDOCAINE 2% (20 MG/ML) 5 ML SYRINGE
INTRAMUSCULAR | Status: AC
  Filled 2024-09-04: qty 5

## 2024-09-04 MED ORDER — IBUPROFEN 600 MG PO TABS: 600.0000 mg | ORAL_TABLET | Freq: Four times a day (QID) | ORAL | 0 refills | Status: AC | PRN

## 2024-09-04 MED ORDER — PROPOFOL 10 MG/ML IV BOLUS
INTRAVENOUS | Status: AC
  Filled 2024-09-04: qty 20

## 2024-09-04 MED ORDER — SODIUM CHLORIDE 0.9 % IR SOLN
Status: DC | PRN
  Administered 2024-09-04: 3000 mL

## 2024-09-04 MED ORDER — DEXAMETHASONE SOD PHOSPHATE PF 10 MG/ML IJ SOLN
INTRAMUSCULAR | Status: AC
  Filled 2024-09-04: qty 1

## 2024-09-04 MED ORDER — PROPOFOL 10 MG/ML IV BOLUS
INTRAVENOUS | Status: DC | PRN
  Administered 2024-09-04: 200 mg via INTRAVENOUS

## 2024-09-04 MED ORDER — DEXAMETHASONE SOD PHOSPHATE PF 10 MG/ML IJ SOLN
INTRAMUSCULAR | Status: DC | PRN
  Administered 2024-09-04: 10 mg via INTRAVENOUS

## 2024-09-04 MED ORDER — PHENYLEPHRINE 80 MCG/ML (10ML) SYRINGE FOR IV PUSH (FOR BLOOD PRESSURE SUPPORT)
PREFILLED_SYRINGE | INTRAVENOUS | Status: DC | PRN
  Administered 2024-09-04 (×3): 160 ug via INTRAVENOUS

## 2024-09-04 MED ORDER — POVIDONE-IODINE 10 % EX SWAB
2.0000 | Freq: Once | CUTANEOUS | Status: AC
  Administered 2024-09-04: 2 via TOPICAL

## 2024-09-04 MED ORDER — CIPROFLOXACIN HCL 500 MG PO TABS: 500.0000 mg | ORAL_TABLET | Freq: Two times a day (BID) | ORAL | 0 refills | Status: AC

## 2024-09-04 MED ORDER — ACETAMINOPHEN 325 MG PO TABS: 650.0000 mg | ORAL_TABLET | Freq: Four times a day (QID) | ORAL | Status: AC | PRN

## 2024-09-04 MED ORDER — BUPIVACAINE HCL (PF) 0.25 % IJ SOLN
INTRAMUSCULAR | Status: DC | PRN
  Administered 2024-09-04: 40 mL

## 2024-09-04 MED ORDER — MIDAZOLAM HCL 2 MG/2ML IJ SOLN
INTRAMUSCULAR | Status: AC
  Filled 2024-09-04: qty 2

## 2024-09-04 MED ORDER — ONDANSETRON HCL 4 MG/2ML IJ SOLN
INTRAMUSCULAR | Status: AC
  Filled 2024-09-04: qty 2

## 2024-09-04 MED ORDER — ONDANSETRON HCL 4 MG/2ML IJ SOLN: 4.0000 mg | Freq: Once | INTRAMUSCULAR | Status: DC | PRN

## 2024-09-04 MED ORDER — ACETAMINOPHEN 10 MG/ML IV SOLN: 1000.0000 mg | Freq: Once | INTRAVENOUS | Status: DC

## 2024-09-04 MED ORDER — MIDAZOLAM HCL (PF) 2 MG/2ML IJ SOLN
INTRAMUSCULAR | Status: DC | PRN
  Administered 2024-09-04: 2 mg via INTRAVENOUS

## 2024-09-04 MED ORDER — OXYCODONE HCL 5 MG PO TABS: 5.0000 mg | ORAL_TABLET | Freq: Four times a day (QID) | ORAL | 0 refills | Status: AC | PRN

## 2024-09-04 MED ORDER — HYDROMORPHONE HCL 1 MG/ML IJ SOLN
INTRAMUSCULAR | Status: DC | PRN
  Administered 2024-09-04: .5 mg via INTRAVENOUS

## 2024-09-04 MED ORDER — OXYCODONE HCL 5 MG/5ML PO SOLN: 5.0000 mg | Freq: Once | ORAL | Status: AC | PRN

## 2024-09-04 MED ORDER — KETOROLAC TROMETHAMINE 30 MG/ML IJ SOLN
30.0000 mg | INTRAMUSCULAR | Status: AC
  Administered 2024-09-04: 30 mg via INTRAVENOUS
  Filled 2024-09-04: qty 1

## 2024-09-04 MED ORDER — FENTANYL CITRATE (PF) 250 MCG/5ML IJ SOLN
INTRAMUSCULAR | Status: AC
  Filled 2024-09-04: qty 5

## 2024-09-04 MED ORDER — FENTANYL CITRATE (PF) 250 MCG/5ML IJ SOLN
INTRAMUSCULAR | Status: DC | PRN
  Administered 2024-09-04: 100 ug via INTRAVENOUS
  Administered 2024-09-04: 50 ug via INTRAVENOUS
  Administered 2024-09-04: 100 ug via INTRAVENOUS

## 2024-09-04 MED ORDER — SCOPOLAMINE 1 MG/3DAYS TD PT72: 1.0000 | MEDICATED_PATCH | TRANSDERMAL | Status: DC

## 2024-09-04 MED ORDER — BUPIVACAINE HCL (PF) 0.25 % IJ SOLN
INTRAMUSCULAR | Status: AC
  Filled 2024-09-04: qty 60

## 2024-09-04 MED ORDER — ROCURONIUM BROMIDE 10 MG/ML (PF) SYRINGE
PREFILLED_SYRINGE | INTRAVENOUS | Status: DC | PRN
  Administered 2024-09-04: 80 mg via INTRAVENOUS
  Administered 2024-09-04 (×2): 20 mg via INTRAVENOUS

## 2024-09-04 MED ORDER — SCOPOLAMINE 1 MG/3DAYS TD PT72
MEDICATED_PATCH | TRANSDERMAL | Status: AC
  Administered 2024-09-04: 1 mg via TRANSDERMAL
  Filled 2024-09-04: qty 1

## 2024-09-04 MED ORDER — CHLORHEXIDINE GLUCONATE 0.12 % MT SOLN
15.0000 mL | Freq: Once | OROMUCOSAL | Status: AC
  Administered 2024-09-04: 15 mL via OROMUCOSAL

## 2024-09-04 MED ORDER — ONDANSETRON HCL 4 MG/2ML IJ SOLN
INTRAMUSCULAR | Status: DC | PRN
  Administered 2024-09-04: 4 mg via INTRAVENOUS

## 2024-09-04 MED ORDER — FENTANYL CITRATE (PF) 50 MCG/ML IJ SOSY
PREFILLED_SYRINGE | INTRAMUSCULAR | Status: AC
  Filled 2024-09-04: qty 1

## 2024-09-04 MED ORDER — GABAPENTIN 300 MG PO CAPS: 300.0000 mg | ORAL_CAPSULE | Freq: Three times a day (TID) | ORAL | 0 refills | Status: AC

## 2024-09-04 MED ORDER — LIDOCAINE 2% (20 MG/ML) 5 ML SYRINGE
INTRAMUSCULAR | Status: DC | PRN
  Administered 2024-09-04: 100 mg via INTRAVENOUS

## 2024-09-04 MED ORDER — FENTANYL CITRATE (PF) 50 MCG/ML IJ SOSY
25.0000 ug | PREFILLED_SYRINGE | INTRAMUSCULAR | Status: DC | PRN
  Administered 2024-09-04 (×3): 50 ug via INTRAVENOUS
  Filled 2024-09-04 (×2): qty 1

## 2024-09-04 NOTE — Progress Notes (Addendum)
 Dr.Ozan and Dr.Kiel notified patient had  3 sips of 7 up during the night and also patient had head cold taking Mucinex. Per patient. No new orders given.

## 2024-09-04 NOTE — Transfer of Care (Signed)
 Immediate Anesthesia Transfer of Care Note  Patient: Charlina Dwight  Procedure(s) Performed: HYSTERECTOMY, TOTAL, LAPAROSCOPIC, ROBOT-ASSISTED WITH SALPINGECTOMY (Bilateral)  Patient Location: PACU  Anesthesia Type:General  Level of Consciousness: awake, drowsy, and patient cooperative  Airway & Oxygen Therapy: Patient Spontanous Breathing and Patient connected to face mask oxygen  Post-op Assessment: Report given to RN, Post -op Vital signs reviewed and stable, and Patient moving all extremities X 4  Post vital signs: Reviewed and stable  Last Vitals:  Vitals Value Taken Time  BP 129/75 09/04/24 10:30  Temp    Pulse 81 09/04/24 10:34  Resp 17 09/04/24 10:34  SpO2 100 % 09/04/24 10:34  Vitals shown include unfiled device data.  Last Pain:  Vitals:   09/04/24 0652  TempSrc:   PainSc: 4       Patients Stated Pain Goal: 7 (09/04/24 0650)  Complications: No notable events documented.

## 2024-09-04 NOTE — Anesthesia Preprocedure Evaluation (Signed)
"                                    Anesthesia Evaluation  Patient identified by MRN, date of birth, ID band Patient awake    Reviewed: Allergy & Precautions, H&P , NPO status , Patient's Chart, lab work & pertinent test results, reviewed documented beta blocker date and time   History of Anesthesia Complications (+) PONV and history of anesthetic complications  Airway Mallampati: II  TM Distance: >3 FB Neck ROM: full    Dental no notable dental hx.    Pulmonary neg pulmonary ROS   Pulmonary exam normal breath sounds clear to auscultation       Cardiovascular Exercise Tolerance: Good hypertension, + dysrhythmias  Rhythm:regular Rate:Normal     Neuro/Psych  PSYCHIATRIC DISORDERS Anxiety Depression     Neuromuscular disease    GI/Hepatic Neg liver ROS,GERD  ,,  Endo/Other    Class 4 obesity  Renal/GU negative Renal ROS  negative genitourinary   Musculoskeletal   Abdominal   Peds  Hematology negative hematology ROS (+)   Anesthesia Other Findings   Reproductive/Obstetrics negative OB ROS                              Anesthesia Physical Anesthesia Plan  ASA: 3  Anesthesia Plan: General and General ETT   Post-op Pain Management:    Induction:   PONV Risk Score and Plan: Ondansetron   Airway Management Planned:   Additional Equipment:   Intra-op Plan:   Post-operative Plan:   Informed Consent: I have reviewed the patients History and Physical, chart, labs and discussed the procedure including the risks, benefits and alternatives for the proposed anesthesia with the patient or authorized representative who has indicated his/her understanding and acceptance.     Dental Advisory Given  Plan Discussed with: CRNA  Anesthesia Plan Comments:         Anesthesia Quick Evaluation  "

## 2024-09-04 NOTE — Discharge Instructions (Addendum)
 Post Operative Pain Med Plan:  >Take gabapentin  300 mg three times per day, as prescribed for 4 days, try to space them evenly  >Take Ibuprofen  600mg  every 6 hours as needed.  DO NOT TAKE the Meloxicam and Ibuprofen  together- take one or the other.  >In between the Ibuprofen , take Tylenol  (over the counter) every 6 to 8 hours.  If the Tylenol  does not seem strong enough.  Take the tylenol  along with the oxycodone  every 6 hours If the oxycodone  seems to strong then just take Tylenol   >Oxycodone  will cause constipation, please be sure to take a stool softener (Colace) twice daily while taking this pain medication and/or continue this medication until your bowel regimen returns to normal  If possible try to take the Toradol  or Ibuprofen  with food to help avoid upsetting your stomach  >Use a heating pad as well as needed  >I have also sent a prescription for zofran  (ondansetron ) for nausea to take if needed over the first couple of days  >Be gentle with your diet the first few days, liquids and soft non spicy food, fruits are great  >Get up and move, no lifting or straining  HOME INSTRUCTIONS  Please note any unusual or excessive bleeding, pain, swelling. Mild dizziness or drowsiness are normal for about 24 hours after surgery.   Shower when comfortable  Restrictions: No driving for 24 hours or while taking pain medications.  Activity:  No heavy lifting (> 10 lbs), nothing in vagina (no tampons, douching, or intercourse) x 4 weeks; no tub baths for 4 weeks Vaginal spotting is expected but if your bleeding is heavy, period like,  please call the office   Incision: the bandaids will fall off when they are ready to; you may clean your incision with mild soap and water  but do not rub or scrub the incision site.  You may experience slight bloody drainage from your incision periodically.  This is normal.  If you experience a large amount of drainage or the incision opens, please call your  physician who will likely direct you to the emergency department.  Diet:  You may return to your regular diet.  Do not eat large meals.  Eat small frequent meals throughout the day.  Continue to drink a good amount of water  at least 6-8 glasses of water  per day, hydration is very important for the healing process.  Pain Management: Follow the instructions as above  Always take prescription pain medication with food.  Percocet may cause constipation, you may want to take a stool softener while taking this medication.  A prescription of colace has been sent in to take twice daily if needed while taking the oxycodone .  Be sure to drink plenty of fluids and increase your fiber to help with constipation.  Alcohol -- Avoid for 24 hours and while taking pain medications.  Nausea: Take sips of ginger ale or soda  Fever -- Call physician if temperature over 101 degrees  Follow up:  If you do not already have a follow up appointment scheduled, please call the office at [phone].  If you experience fever (a temperature greater than 100.4), pain unrelieved by pain medication, shortness of breath, swelling of a single leg, or any other symptoms which are concerning to you please the office immediately.

## 2024-09-04 NOTE — Op Note (Signed)
 Pre Op Dx:   Simple hyperplasia  Post Op Dx:   Same and Pelvic adhesions  Procedure:   Robotic Assisted Total Laparoscopic Hysterectomy and Bilateral Salpingectomy, Lysis of Adhesions   Surgeon:  Dr. Delon Prude Assistants:  Dr. Herlene Inch Anesthesia:  general   EBL:  40cc  IVF:  1000cc UOP:  150cc   Drains:  Foley catheter Specimen removed:  Uterus with cervix and bilateral fallopian tubes Findings:  Filmy adhesions of the anterior uterus to the anterior abdominal wall. Left ovary slightly enlarged- appears to have simple cyst.  Normal fallopian tubes bilaterally.  Normal right ovary.  Complications: None  Description of procedure:  After informed consent the patient was taken to the operating room and placed in dorsal supine position where general endotracheal anesthesia was administered and found to be adequate.  She was placed in dorsal lithotomy position with her arms tucked.  She was prepped and draped in the usual sterile fashion.  A timeout was called and the procedure confirmed.  A foley catheter was placed and an additional vaginal prep was completed.  Due to the positioning of the cervix, there was some difficulty with visualization; however with traction using a tenaculum the cervix was visualized.  Mirena was removed.  A RUMI uterine manipulator with the Koh cup was placed.   An incision was made in the supraumbilical area and the Veress needle was inserted into the abdominal cavity without difficulty. Proper placement was confirmed using the saline drop test and opening pressure was 5 mmHg. A pneumoperitoneum was obtained. The laparoscopic trocar and the laparoscope were placed under direct visualization.  Three additional 7mm ports were placed on either side of the umbilicus and an 8mm port was placed in the left upper quadrant under direct visualization.  The patient was placed in Trendelenburg position and the [employer] robotic device was docked.  Next, attention was  turned to the console where the hysterectomy was performed.  Filmy adhesions were taken down using the vessel sealer and blunt dissection.  Once the anatomy was normalized.  The right fallopian tube was divided at the mesosalpinx and the uteroovarian anastamosis was divided and the right round ligament was divided.   This process was repeated on the contralateral side.  The anterior leaflet of the broad ligament was divided to create a bladder flap. A small anterior and posterior colpotomy incision was made to mark the Koh cup. The uterine artery and vein were skeletonized and desiccated superior to the Koh cup.  This process was repeated on the contralateral side.  The colpotomy was completed along the ridge of the Koh cup and the uterus was passed off the field. The vaginal occluder was placed in the vagina to maintain pneumoperitoneum.  The vaginal cuff was then closed with 0- Stratifix suture in a double layer fashion. Ureters with peristalsis were visualized bilaterally.  Hemostasis confirmed. Arista powder was placed on the vaginal cuff and adnexa bilaterally.  The Da Vinci robotic device was undocked.  Under direct visualization TAP block was completed under direct visualization using 10cc of 0.25% marcaine  in each of four locations.  Airseal was deflated and trocars were removed.The skin was closed with 4-0 Vicryl in subcuticular fashion with skin glue placed atop each port site.    The patient was returned to dorsal supine position, awakened and extubated in the OR having appeared to tolerate the procedure well.  All sponge, needle, and instrument counts were correct x 2 at the end of the  case.  Pt tolerated procedure well and was taken to recovery in stable condition.  Experienced assistants were required to be given the standard of surgical care given the omplexity of the case.  The assistants were needed for exposure, dissection, suctioning, retraction, instrument exchange, and for overall help  during the procedure.   Janny Crute, DO Attending Obstetrician & Gynecologist, [facility] for [employer], [facility]

## 2024-09-04 NOTE — Anesthesia Procedure Notes (Signed)
 Procedure Name: Intubation Date/Time: 09/04/2024 7:45 AM  Performed by: Cordella Elvie HERO, CRNAPre-anesthesia Checklist: Patient identified, Emergency Drugs available, Suction available, Patient being monitored and Timeout performed Patient Re-evaluated:Patient Re-evaluated prior to induction Oxygen Delivery Method: Circle system utilized Preoxygenation: Pre-oxygenation with 100% oxygen Induction Type: IV induction Ventilation: Oral airway inserted - appropriate to patient size Laryngoscope Size: Mac and 3 Grade View: Grade I Tube type: Oral Tube size: 7.0 mm Number of attempts: 2 Airway Equipment and Method: Stylet Placement Confirmation: ETT inserted through vocal cords under direct vision, positive ETCO2, CO2 detector and breath sounds checked- equal and bilateral Secured at: 22 cm Tube secured with: Tape Dental Injury: Teeth and Oropharynx as per pre-operative assessment  Comments: Attempted intubation by medical student, unsuccessful attempt. Intubation performed by CRNA, CANDIE Cordella, successful intubation, no damage to oropharynx noted.

## 2024-09-05 ENCOUNTER — Telehealth: Payer: Self-pay | Admitting: Obstetrics & Gynecology

## 2024-09-05 ENCOUNTER — Encounter (HOSPITAL_COMMUNITY): Payer: Self-pay | Admitting: Obstetrics & Gynecology

## 2024-09-05 ENCOUNTER — Ambulatory Visit: Payer: Self-pay | Admitting: Obstetrics & Gynecology

## 2024-09-05 LAB — SURGICAL PATHOLOGY

## 2024-09-05 NOTE — Telephone Encounter (Signed)
 Called pt to check in -pain seems to be doing ok and overall the only thing she has noted is some pain in her bottom -not yet passing gas or had a BM -encouraged ambulation, lots of water  and continued stool softener -anticipate passing gas in the next few days and BM by this weekend- if not or other acute issues- contact office  -questions/concerns were addressed -f/u as scheduled  Jyrah Blye, DO Attending Obstetrician & Gynecologist, [facility] [employer], [facility]

## 2024-09-06 NOTE — Anesthesia Postprocedure Evaluation (Signed)
"   Anesthesia Post Note  Patient: Sabrina Jordan  Procedure(s) Performed: HYSTERECTOMY, TOTAL, LAPAROSCOPIC, ROBOT-ASSISTED WITH SALPINGECTOMY (Bilateral) LYSIS, ADHESIONS, ROBOT-ASSISTED, LAPAROSCOPIC (Abdomen)  Patient location during evaluation: Phase II Anesthesia Type: General Level of consciousness: awake Pain management: pain level controlled Vital Signs Assessment: post-procedure vital signs reviewed and stable Respiratory status: spontaneous breathing and respiratory function stable Cardiovascular status: blood pressure returned to baseline and stable Postop Assessment: no headache and no apparent nausea or vomiting Anesthetic complications: no Comments: Late entry   No notable events documented.   Last Vitals:  Vitals:   09/04/24 1215 09/04/24 1225  BP: 102/61 (!) 137/48  Pulse: 72 71  Resp: 20 16  Temp: 36.8 C 36.7 C  SpO2: 100% 100%    Last Pain:  Vitals:   09/05/24 1401  TempSrc:   PainSc: 3                  Sabrina Jordan PARAS Nihaal Friesen      "

## 2024-09-16 ENCOUNTER — Encounter: Admitting: Obstetrics & Gynecology

## 2024-09-18 ENCOUNTER — Ambulatory Visit: Admitting: Obstetrics & Gynecology

## 2024-09-18 ENCOUNTER — Encounter: Payer: Self-pay | Admitting: Obstetrics & Gynecology

## 2024-09-18 VITALS — BP 128/83 | HR 103 | Ht 62.0 in | Wt 264.4 lb

## 2024-09-18 DIAGNOSIS — Z48816 Encounter for surgical aftercare following surgery on the genitourinary system: Secondary | ICD-10-CM

## 2024-09-18 DIAGNOSIS — Z4889 Encounter for other specified surgical aftercare: Secondary | ICD-10-CM

## 2024-09-18 NOTE — Progress Notes (Signed)
" ° ° °  PostOp Visit Note  Sabrina Jordan is a 44 y.o. G66P0011 female who presents for a postoperative visit. She is 2 weeks postop following a RAH, BS completed on 1/14   Today she notes that the first week was tough, but doing better.  Denies fever or chills.  Tolerating gen diet.  +Flatus, Regular BMs.  Pain is minimal.  Notes some bladder spasms, but does not feel like a UTI.  Overall doing well and reports no acute complaints   Review of Systems Pertinent items are noted in HPI.    Objective:  BP 128/83 (BP Location: Right Arm, Patient Position: Sitting, Cuff Size: Large)   Pulse (!) 103   Ht 5' 2 (1.575 m)   Wt 264 lb 6.4 oz (119.9 kg)   LMP 07/03/2024   BMI 48.36 kg/m    Physical Examination:  GENERAL ASSESSMENT: well developed and well nourished SKIN: normal color, no lesions CHEST: normal air exchange, respiratory effort normal with no retractions HEART: regular rate and rhythm ABDOMEN: obese, soft, non-distended, no rebound or guarding INCISION: healing appropriately- clean and dry with dermabond EXTREMITY: no edema or calf tenderness bilaterally PSYCH: mood appropriate, normal affect       Assessment:    Postop   Plan:   -meeting milestones appropriately -reviewed pelvic rest -reviewed pathology- benign -f/u in 6wks for final visit  Tristian Bouska, DO Attending Obstetrician & Gynecologist, [facility] [employer], [facility]   "

## 2024-10-30 ENCOUNTER — Encounter: Admitting: Obstetrics & Gynecology
